# Patient Record
Sex: Male | Born: 1970 | Hispanic: No | Smoking: Former smoker
Health system: Southern US, Community
[De-identification: ages and names within clinical notes are randomized; demographics above are authoritative.]

## PROBLEM LIST (undated history)

## (undated) DIAGNOSIS — T7840XA Allergy, unspecified, initial encounter: Secondary | ICD-10-CM

## (undated) DIAGNOSIS — F419 Anxiety disorder, unspecified: Secondary | ICD-10-CM

## (undated) DIAGNOSIS — F32A Depression, unspecified: Secondary | ICD-10-CM

## (undated) DIAGNOSIS — G4730 Sleep apnea, unspecified: Secondary | ICD-10-CM

## (undated) DIAGNOSIS — K219 Gastro-esophageal reflux disease without esophagitis: Secondary | ICD-10-CM

## (undated) DIAGNOSIS — K3184 Gastroparesis: Secondary | ICD-10-CM

## (undated) DIAGNOSIS — M1990 Unspecified osteoarthritis, unspecified site: Secondary | ICD-10-CM

## (undated) DIAGNOSIS — J45909 Unspecified asthma, uncomplicated: Secondary | ICD-10-CM

## (undated) DIAGNOSIS — E119 Type 2 diabetes mellitus without complications: Secondary | ICD-10-CM

## (undated) DIAGNOSIS — I10 Essential (primary) hypertension: Secondary | ICD-10-CM

## (undated) DIAGNOSIS — E785 Hyperlipidemia, unspecified: Secondary | ICD-10-CM

## (undated) DIAGNOSIS — F259 Schizoaffective disorder, unspecified: Secondary | ICD-10-CM

## (undated) DIAGNOSIS — N529 Male erectile dysfunction, unspecified: Secondary | ICD-10-CM

## (undated) DIAGNOSIS — E669 Obesity, unspecified: Secondary | ICD-10-CM

## (undated) HISTORY — DX: Allergy, unspecified, initial encounter: T78.40XA

## (undated) HISTORY — DX: Male erectile dysfunction, unspecified: N52.9

## (undated) HISTORY — DX: Obesity, unspecified: E66.9

## (undated) HISTORY — DX: Unspecified asthma, uncomplicated: J45.909

## (undated) HISTORY — DX: Gastro-esophageal reflux disease without esophagitis: K21.9

## (undated) HISTORY — DX: Depression, unspecified: F32.A

## (undated) HISTORY — DX: Sleep apnea, unspecified: G47.30

## (undated) HISTORY — DX: Schizoaffective disorder, unspecified: F25.9

## (undated) HISTORY — DX: Hyperlipidemia, unspecified: E78.5

## (undated) HISTORY — DX: Type 2 diabetes mellitus without complications: E11.9

## (undated) HISTORY — DX: Unspecified osteoarthritis, unspecified site: M19.90

## (undated) HISTORY — DX: Gastroparesis: K31.84

## (undated) HISTORY — DX: Anxiety disorder, unspecified: F41.9

## (undated) HISTORY — DX: Essential (primary) hypertension: I10

---

## 2007-08-10 HISTORY — PX: OTHER SURGICAL HISTORY: SHX169

## 2010-08-09 HISTORY — PX: CHOLECYSTECTOMY, LAPAROSCOPIC: SHX56

## 2021-02-26 ENCOUNTER — Ambulatory Visit: Payer: Self-pay

## 2021-02-26 ENCOUNTER — Ambulatory Visit (INDEPENDENT_AMBULATORY_CARE_PROVIDER_SITE_OTHER): Payer: 59 | Admitting: Physician Assistant

## 2021-02-26 DIAGNOSIS — M25562 Pain in left knee: Secondary | ICD-10-CM

## 2021-02-26 DIAGNOSIS — M25561 Pain in right knee: Secondary | ICD-10-CM | POA: Diagnosis not present

## 2021-02-26 MED ORDER — METHYLPREDNISOLONE ACETATE 40 MG/ML IJ SUSP
40.0000 mg | INTRAMUSCULAR | Status: AC | PRN
  Administered 2021-02-26: 40 mg via INTRA_ARTICULAR

## 2021-02-26 MED ORDER — LIDOCAINE HCL 1 % IJ SOLN
5.0000 mL | INTRAMUSCULAR | Status: AC | PRN
  Administered 2021-02-26: 5 mL

## 2021-02-26 NOTE — Progress Notes (Signed)
Office Visit Note   Patient: Leon Ross           Date of Birth: [birth year]           MRN: [id number] Visit Date: 02/26/2021              Requested by: Dema Severin, NP [address] [place],  [place] [address] PCP: Dema Severin, NP  Chief Complaint  Patient presents with   Right Knee - Pain   Left Knee - Pain      HPI: Patient is a pleasant 50 year old gentleman who complains of right greater than left medial knee pain.  Is been going on for couple months.  He denies any injuries but he is very active and squats down quite a lot for his job.  He points to the pain on the medial aspect of his knee.  Sometimes he gets popping.  He has not done any treatment specific  Assessment & Plan: Visit Diagnoses:  1. Acute pain of right knee   2. Acute pain of left knee     Plan: Findings consistent with a degenerative tear of the medial meniscus.  We will try an injection today.  He only would like to try the injection on the right side as the left side is not terribly symptomatic to him.  He will follow-up in 3 weeks for reassessment  Follow-Up Instructions: No follow-ups on file.   Ortho Exam  Patient is alert, oriented, no adenopathy, well-dressed, normal affect, normal respiratory effort. Bilateral knees no effusions no cellulitis mild soft tissue swelling in the right knee.  On the left knee has good range of motion minimal tenderness over the medial joint line nothing over the lateral or the patellofemoral joint.  Good varus and valgus stability.  Negative Lachman sign On the right knee he he has tenderness directly over the medial joint line.  This is reproduced with flexion and slightly with extension.  No tenderness around the patellofemoral joint.  No varus valgus instability good endpoint on Lachman testing  Imaging: No results found. No images are attached to the encounter.  Labs: No results found for: HGBA1C, ESRSEDRATE, CRP, LABURIC, REPTSTATUS, GRAMSTAIN, CULT,  LABORGA   No results found for: ALBUMIN, PREALBUMIN, CBC  No results found for: MG No results found for: VD25OH  No results found for: PREALBUMIN No flowsheet data found.   There is no height or weight on file to calculate BMI.  Orders:  Orders Placed This Encounter  Procedures   XR Knee 1-2 Views Left   XR Knee 1-2 Views Right   No orders of the defined types were placed in this encounter.    Procedures: Large Joint Inj on 02/26/2021 11:58 AM Indications: pain and diagnostic evaluation Details: 22 G 1.5 in needle, anteromedial approach  Arthrogram: No  Medications: 40 mg methylPREDNISolone acetate 40 MG/ML; 5 mL lidocaine 1 % Outcome: tolerated well, no immediate complications Procedure, treatment alternatives, risks and benefits explained, specific risks discussed. Consent was given by the patient.     Clinical Data: No additional findings.  ROS:  All other systems negative, except as noted in the HPI. Review of Systems  Objective: Vital Signs: There were no vitals taken for this visit.  Specialty Comments:  No specialty comments available.  PMFS History: There are no problems to display for this patient.  No past medical history on file.  No family history on file.   Social History   Occupational History  Not on file  Tobacco Use   Smoking status: Not on file   Smokeless tobacco: Not on file  Substance and Sexual Activity   Alcohol use: Not on file   Drug use: Not on file   Sexual activity: Not on file

## 2021-03-19 ENCOUNTER — Encounter: Payer: Self-pay | Admitting: Orthopedic Surgery

## 2021-03-19 ENCOUNTER — Ambulatory Visit (INDEPENDENT_AMBULATORY_CARE_PROVIDER_SITE_OTHER): Payer: 59 | Admitting: Orthopedic Surgery

## 2021-03-19 DIAGNOSIS — M25561 Pain in right knee: Secondary | ICD-10-CM

## 2021-03-19 MED ORDER — HYDROCODONE-ACETAMINOPHEN 5-325 MG PO TABS: 1.0000 | ORAL_TABLET | ORAL | 0 refills | Status: DC | PRN

## 2021-03-19 NOTE — Progress Notes (Signed)
   Office Visit Note   Patient: Leon Ross           Date of Birth: 1970/12/29           MRN: [id number] Visit Date: 03/19/2021              Requested by: Dema Severin, NP [address] [place],  [place] [address] PCP: Dema Severin, NP  No chief complaint on file.     HPI: Patient is a pleasant gentleman who follows up today 3 weeks after having a injection into his right knee.  He has a 2-1/60-month history of medial right knee pain.  He states he has been climbing in and out of a [employer] quite a bit working for [employer]-.  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  She does have a history of falls and he has to help her up.  He says he cannot squat down because when he does his knee gets "stuck "he has mechanical symptoms of catching.  He said the injection did not give him relief  Assessment & Plan: Visit Diagnoses:  1. Acute pain of right knee     Plan: Patient will obtain an MRI should follow-up with [place] after that.  May need a arthroscopy for debridement of the medial meniscus tear  Follow-Up Instructions: No follow-ups on file.   Ortho Exam  Patient is alert, oriented, no adenopathy, well-dressed, normal affect, normal respiratory effort. Examination he has no redness he has no effusion.  He is focally tender over the medial joint line.  This is accentuated with terminal flexion and extension.  Good endpoint on Lachman testing  Imaging: No results found. No images are attached to the encounter.  Labs: No results found for: HGBA1C, ESRSEDRATE, CRP, LABURIC, REPTSTATUS, GRAMSTAIN, CULT, LABORGA   No results found for: ALBUMIN, PREALBUMIN, CBC  No results found for: MG No results found for: VD25OH  No results found for: PREALBUMIN No flowsheet data found.   There is no height or weight on file to calculate BMI.  Orders:  Orders Placed This Encounter  Procedures   MR Knee Right w/o contrast   Meds ordered this encounter  Medications    HYDROcodone-acetaminophen (NORCO/VICODIN) 5-325 MG tablet    Sig: Take 1 tablet by mouth every 4 (four) hours as needed for moderate pain.    Dispense:  30 tablet    Refill:  0     Procedures: No procedures performed  Clinical Data: No additional findings.  ROS:  All other systems negative, except as noted in the HPI. Review of Systems  Objective: Vital Signs: There were no vitals taken for this visit.  Specialty Comments:  No specialty comments available.  PMFS History: There are no problems to display for this patient.  No past medical history on file.  No family history on file.   Social History   Occupational History   Not on file  Tobacco Use   Smoking status: Not on file   Smokeless tobacco: Not on file  Substance and Sexual Activity   Alcohol use: Not on file   Drug use: Not on file   Sexual activity: Not on file

## 2021-04-10 ENCOUNTER — Ambulatory Visit (HOSPITAL_COMMUNITY): Payer: 59

## 2021-04-28 ENCOUNTER — Other Ambulatory Visit (HOSPITAL_COMMUNITY): Payer: 59

## 2021-04-30 ENCOUNTER — Ambulatory Visit (HOSPITAL_COMMUNITY)
Admission: RE | Admit: 2021-04-30 | Discharge: 2021-04-30 | Disposition: A | Discharge status: Home / Self Care | Payer: 59 | Source: Ambulatory Visit | Attending: Physician Assistant | Admitting: Physician Assistant

## 2021-04-30 ENCOUNTER — Other Ambulatory Visit: Payer: Self-pay

## 2021-04-30 DIAGNOSIS — M25561 Pain in right knee: Secondary | ICD-10-CM | POA: Insufficient documentation

## 2021-05-08 ENCOUNTER — Ambulatory Visit: Payer: 59 | Admitting: Physician Assistant

## 2021-05-12 ENCOUNTER — Ambulatory Visit (INDEPENDENT_AMBULATORY_CARE_PROVIDER_SITE_OTHER): Payer: 59 | Admitting: Orthopedic Surgery

## 2021-05-12 ENCOUNTER — Encounter: Payer: Self-pay | Admitting: Orthopedic Surgery

## 2021-05-12 DIAGNOSIS — M25561 Pain in right knee: Secondary | ICD-10-CM

## 2021-05-12 MED ORDER — METHYLPREDNISOLONE ACETATE 40 MG/ML IJ SUSP
40.0000 mg | INTRAMUSCULAR | Status: AC | PRN
  Administered 2021-05-12: 40 mg via INTRA_ARTICULAR

## 2021-05-12 MED ORDER — LIDOCAINE HCL (PF) 1 % IJ SOLN
5.0000 mL | INTRAMUSCULAR | Status: AC | PRN
  Administered 2021-05-12: 5 mL

## 2021-05-12 NOTE — Progress Notes (Signed)
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  He spent the afternoon dose follow-up status post MRI scan  She states his pain is  HPI: Pa merrily over the medial joint line.  Patient states that after his last and tient is a 50 year old woman  Assessment & Plan: Visit Diagnoses:  1. Acute pain of right knee     Plan: Right knee was injected he tolerated this well he will take 2 days off and an order is placed for physical therapy.  Follow-Up Instructions: Return in about 4 weeks (around 06/09/2021).   Ortho Exam  Patient is alert, oriented, no adenopathy, well-dressed, normal affect, normal respiratory effort. Examination there is no effusion collaterals and cruciates are stable he is tender to palpation on the patellofemoral joint and has crepitation of patellofemoral joint with range of motion he is also tender to palpation over the medial joint line lateral joint line is nontender to palpation.  Review of the MRI scan shows a possible tear of the lateral meniscus however patient is asymptomatic at this location it does show tendinosis of the patella tendon as well as cartilage irregularities of the trochlea there is a small effusion of the MRI scan.  The lateral meniscal finding of a possible meniscal tear is not consistent with his symptoms.  Imaging: No results found. No images are attached to the encounter.  Labs: No results found for: HGBA1C, ESRSEDRATE, CRP, LABURIC, REPTSTATUS, GRAMSTAIN, CULT, LABORGA   No results found for: ALBUMIN, PREALBUMIN, CBC  No results found for: MG No results found for: VD25OH  No results  found for: PREALBUMIN No flowsheet data found.   There is no height or weight on file to calculate BMI.  Orders:  No orders of the defined types were placed in this encounter.  No orders of the defined types were placed in this encounter.    Procedures: Large Joint Inj: R knee on 05/12/2021 3:57 PM Indications: pain and diagnostic evaluation Details: 22 G 1.5 in needle, anteromedial approach  Arthrogram: No  Medications: 5 mL lidocaine (PF) 1 %; 40 mg methylPREDNISolone acetate 40 MG/ML Outcome: tolerated well, no immediate complications Procedure, treatment alternatives, risks and benefits explained, specific risks discussed. Consent was given by the patient. Immediately prior to procedure a time out was called to verify the correct patient, procedure, equipment, support staff and site/side marked as required. Patient was prepped and draped in the usual sterile fashion.     Clinical Data: No additional findings.  ROS:  All other systems negative, except as noted in the HPI. Review of Systems  Objective: Vital Signs: There were no vitals taken for this visit.  Specialty Comments:  No specialty comments available.  PMFS History: There are no problems to display for this patient.  History reviewed. No pertinent past medical history.  History reviewed. No pertinent family history.  History reviewed. No pertinent surgical history. Social History   Occupational  History   Not on file  Tobacco Use   Smoking status: Not on file   Smokeless tobacco: Not on file  Substance and Sexual Activity   Alcohol use: Not on file   Drug use: Not on file   Sexual activity: Not on file

## 2021-05-15 ENCOUNTER — Encounter: Payer: Self-pay | Admitting: Urology

## 2021-05-15 ENCOUNTER — Ambulatory Visit (INDEPENDENT_AMBULATORY_CARE_PROVIDER_SITE_OTHER): Payer: 59 | Admitting: Urology

## 2021-05-15 ENCOUNTER — Other Ambulatory Visit: Payer: Self-pay

## 2021-05-15 VITALS — BP 119/75 | HR 90 | Ht 67.5 in | Wt 271.0 lb

## 2021-05-15 DIAGNOSIS — N5201 Erectile dysfunction due to arterial insufficiency: Secondary | ICD-10-CM | POA: Diagnosis not present

## 2021-05-15 DIAGNOSIS — E291 Testicular hypofunction: Secondary | ICD-10-CM

## 2021-05-15 MED ORDER — TADALAFIL 20 MG PO TABS: ORAL_TABLET | ORAL | 0 refills | Status: DC
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
## 2021-05-18 ENCOUNTER — Encounter: Payer: Self-pay | Admitting: Urology

## 2021-05-18 ENCOUNTER — Other Ambulatory Visit: Payer: Self-pay

## 2021-05-19 ENCOUNTER — Encounter: Payer: Self-pay | Admitting: Urology

## 2021-05-20 ENCOUNTER — Telehealth: Payer: Self-pay | Admitting: Urology

## 2021-05-20 NOTE — Telephone Encounter (Signed)
Patient would like to do the injections.

## 2021-05-20 NOTE — Telephone Encounter (Signed)
Labs from PCP reviewed and he has had 2 low testosterone levels.  LH level was normal.  He would be a candidate for topical testosterone gel or intramuscular injections depending on his preference.  Both are effective.  He will most likely need prior authorization for both forms of treatment.  Let me know if he has any questions or which method he prefers.

## 2021-05-21 ENCOUNTER — Emergency Department (HOSPITAL_COMMUNITY)
Admission: EM | Admit: 2021-05-21 | Discharge: 2021-05-22 | Disposition: A | Discharge status: Other Acute Inpatient Hospital | Payer: 59 | Attending: Emergency Medicine | Admitting: Emergency Medicine

## 2021-05-21 ENCOUNTER — Encounter (HOSPITAL_COMMUNITY): Payer: Self-pay | Admitting: Emergency Medicine

## 2021-05-21 ENCOUNTER — Other Ambulatory Visit: Payer: Self-pay

## 2021-05-21 DIAGNOSIS — R45851 Suicidal ideations: Secondary | ICD-10-CM | POA: Insufficient documentation

## 2021-05-21 DIAGNOSIS — Z7984 Long term (current) use of oral hypoglycemic drugs: Secondary | ICD-10-CM | POA: Insufficient documentation

## 2021-05-21 DIAGNOSIS — Z20822 Contact with and (suspected) exposure to covid-19: Secondary | ICD-10-CM | POA: Insufficient documentation

## 2021-05-21 DIAGNOSIS — F3110 Bipolar disorder, current episode manic without psychotic features, unspecified: Secondary | ICD-10-CM | POA: Insufficient documentation

## 2021-05-21 DIAGNOSIS — E119 Type 2 diabetes mellitus without complications: Secondary | ICD-10-CM | POA: Insufficient documentation

## 2021-05-21 LAB — COMPREHENSIVE METABOLIC PANEL
ALT: 20 U/L (ref 0–44)
AST: 18 U/L (ref 15–41)
Albumin: 4 g/dL (ref 3.5–5.0)
Alkaline Phosphatase: 89 U/L (ref 38–126)
Anion gap: 8 (ref 5–15)
BUN: 14 mg/dL (ref 6–20)
CO2: 27 mmol/L (ref 22–32)
Calcium: 9.3 mg/dL (ref 8.9–10.3)
Chloride: 104 mmol/L (ref 98–111)
Creatinine, Ser: 0.97 mg/dL (ref 0.61–1.24)
GFR, Estimated: >60 mL/min (ref >60)
Glucose, Bld: 127 mg/dL — ABNORMAL HIGH (ref 70–99)
Potassium: 4 mmol/L (ref 3.5–5.1)
Sodium: 139 mmol/L (ref 135–145)
Total Bilirubin: 0.4 mg/dL (ref 0.3–1.2)
Total Protein: 7.6 g/dL (ref 6.5–8.1)

## 2021-05-21 LAB — CBC WITH DIFFERENTIAL/PLATELET
Abs Immature Granulocytes: 0.03 10*3/uL (ref 0.00–0.07)
Basophils Absolute: 0 10*3/uL (ref 0.0–0.1)
Basophils Relative: 0 %
Eosinophils Absolute: 0.2 10*3/uL (ref 0.0–0.5)
Eosinophils Relative: 2 %
HCT: 41.7 % (ref 39.0–52.0)
Hemoglobin: 13.5 g/dL (ref 13.0–17.0)
Immature Granulocytes: 0 %
Lymphocytes Relative: 21 %
Lymphs Abs: 2.1 10*3/uL (ref 0.7–4.0)
MCH: 24.7 pg — ABNORMAL LOW (ref 26.0–34.0)
MCHC: 32.4 g/dL (ref 30.0–36.0)
MCV: 76.2 fL — ABNORMAL LOW (ref 80.0–100.0)
Monocytes Absolute: 0.7 10*3/uL (ref 0.1–1.0)
Monocytes Relative: 7 %
Neutro Abs: 7 10*3/uL (ref 1.7–7.7)
Neutrophils Relative %: 70 %
Platelets: 316 10*3/uL (ref 150–400)
RBC: 5.47 MIL/uL (ref 4.22–5.81)
RDW: 14.3 % (ref 11.5–15.5)
WBC: 10 10*3/uL (ref 4.0–10.5)
nRBC: 0 % (ref 0.0–0.2)

## 2021-05-21 LAB — URINALYSIS, ROUTINE W REFLEX MICROSCOPIC
Bilirubin Urine: NEGATIVE
Glucose, UA: NEGATIVE mg/dL
Hgb urine dipstick: NEGATIVE
Ketones, ur: NEGATIVE mg/dL
Leukocytes,Ua: NEGATIVE
Nitrite: NEGATIVE
Protein, ur: NEGATIVE mg/dL
Specific Gravity, Urine: 1.006 (ref 1.005–1.030)
pH: 6 (ref 5.0–8.0)

## 2021-05-21 LAB — RESP PANEL BY RT-PCR (FLU A&B, COVID) ARPGX2
Influenza A by PCR: NEGATIVE
Influenza B by PCR: NEGATIVE
SARS Coronavirus 2 by RT PCR: NEGATIVE

## 2021-05-21 LAB — RAPID URINE DRUG SCREEN, HOSP PERFORMED
Amphetamines: NOT DETECTED
Barbiturates: NOT DETECTED
Benzodiazepines: NOT DETECTED
Cocaine: NOT DETECTED
Opiates: NOT DETECTED
Tetrahydrocannabinol: NOT DETECTED

## 2021-05-21 LAB — ETHANOL: Alcohol, Ethyl (B): <10 mg/dL (ref <10)

## 2021-05-21 MED ORDER — TESTOSTERONE CYPIONATE 200 MG/ML IM SOLN: 200.0000 mg | INTRAMUSCULAR | 0 refills | Status: DC

## 2021-05-21 NOTE — ED Notes (Signed)
Provided pt with sandwich and sprite.
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
## 2021-05-21 NOTE — ED Notes (Signed)
Patients wife would like a call back with an update: Zoe [phone]

## 2021-05-21 NOTE — Telephone Encounter (Signed)
Rx sent to pharmacy.  Needs an office visit for injection training.  Schedule midcycle testosterone level 6 weeks after starting therapy

## 2021-05-21 NOTE — Progress Notes (Addendum)
[facility] Pending Acceptance for 05/22/21  [facility] pending acceptance per pending discharges and morning [facility] will coordinate. Pending [facility] Room 300-2. Pt meets inpatient criteria pre [place] Abts, PA-C. Accepting physician Dr. Mason Jim; dx bipolar manic.    Maryjean Ka, MSW, LCSWA 05/21/2021 11:55 PM

## 2021-05-21 NOTE — Addendum Note (Signed)
Addended by: Irineo Axon C on: 05/21/2021 12:49 PM   Modules accepted: Orders

## 2021-05-21 NOTE — ED Triage Notes (Signed)
Pt arrives POV w/ c/o suicidal thoughts and hearing voices. Pt states he has been hearing voices x2 days now and they are telling him to hurt himself but no one else. Pt states he does not have a plan
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
## 2021-05-22 ENCOUNTER — Inpatient Hospital Stay (HOSPITAL_COMMUNITY)
Admission: AD | Admit: 2021-05-22 | Discharge: 2021-05-29 | DRG: 885 | Disposition: A | Discharge status: Home / Self Care | Payer: 59 | Attending: Emergency Medicine | Admitting: Emergency Medicine

## 2021-05-22 ENCOUNTER — Encounter (HOSPITAL_COMMUNITY): Payer: Self-pay | Admitting: Emergency Medicine

## 2021-05-22 DIAGNOSIS — F259 Schizoaffective disorder, unspecified: Secondary | ICD-10-CM | POA: Diagnosis present

## 2021-05-22 DIAGNOSIS — F4310 Post-traumatic stress disorder, unspecified: Secondary | ICD-10-CM | POA: Diagnosis present

## 2021-05-22 DIAGNOSIS — Z7984 Long term (current) use of oral hypoglycemic drugs: Secondary | ICD-10-CM

## 2021-05-22 DIAGNOSIS — G4700 Insomnia, unspecified: Secondary | ICD-10-CM | POA: Diagnosis present

## 2021-05-22 DIAGNOSIS — E669 Obesity, unspecified: Secondary | ICD-10-CM | POA: Diagnosis present

## 2021-05-22 DIAGNOSIS — E785 Hyperlipidemia, unspecified: Secondary | ICD-10-CM | POA: Diagnosis present

## 2021-05-22 DIAGNOSIS — Z20822 Contact with and (suspected) exposure to covid-19: Secondary | ICD-10-CM | POA: Diagnosis present

## 2021-05-22 DIAGNOSIS — Z8659 Personal history of other mental and behavioral disorders: Secondary | ICD-10-CM

## 2021-05-22 DIAGNOSIS — R45851 Suicidal ideations: Secondary | ICD-10-CM | POA: Diagnosis present

## 2021-05-22 DIAGNOSIS — E1169 Type 2 diabetes mellitus with other specified complication: Secondary | ICD-10-CM | POA: Diagnosis present

## 2021-05-22 DIAGNOSIS — K219 Gastro-esophageal reflux disease without esophagitis: Secondary | ICD-10-CM | POA: Diagnosis present

## 2021-05-22 DIAGNOSIS — E119 Type 2 diabetes mellitus without complications: Secondary | ICD-10-CM | POA: Diagnosis present

## 2021-05-22 DIAGNOSIS — F250 Schizoaffective disorder, bipolar type: Principal | ICD-10-CM | POA: Diagnosis present

## 2021-05-22 DIAGNOSIS — F319 Bipolar disorder, unspecified: Secondary | ICD-10-CM | POA: Diagnosis present

## 2021-05-22 HISTORY — DX: Schizoaffective disorder, unspecified: F25.9

## 2021-05-22 LAB — GLUCOSE, CAPILLARY
Glucose-Capillary: 171 mg/dL — ABNORMAL HIGH (ref 70–99)
Glucose-Capillary: 147 mg/dL — ABNORMAL HIGH (ref 70–99)

## 2021-05-22 MED ORDER — TRAZODONE HCL 150 MG PO TABS
300.0000 mg | ORAL_TABLET | Freq: Every day | ORAL | Status: DC
  Administered 2021-05-22: 300 mg via ORAL
  Filled 2021-05-22 (×3): qty 2

## 2021-05-22 MED ORDER — MIRTAZAPINE 15 MG PO TABS
15.0000 mg | ORAL_TABLET | Freq: Every day | ORAL | Status: DC
  Administered 2021-05-22 – 2021-05-28 (×7): 15 mg via ORAL
  Filled 2021-05-22 (×10): qty 1

## 2021-05-22 MED ORDER — INSULIN DEGLUDEC 200 UNIT/ML ~~LOC~~ SOPN: PEN_INJECTOR | Freq: Every day | SUBCUTANEOUS | Status: DC

## 2021-05-22 MED ORDER — PANTOPRAZOLE SODIUM 40 MG PO TBEC
40.0000 mg | DELAYED_RELEASE_TABLET | Freq: Every day | ORAL | Status: DC
  Administered 2021-05-22 – 2021-05-29 (×8): 40 mg via ORAL
  Filled 2021-05-22 (×11): qty 1

## 2021-05-22 MED ORDER — MAGNESIUM HYDROXIDE 400 MG/5ML PO SUSP: 30.0000 mL | Freq: Every day | ORAL | Status: DC | PRN

## 2021-05-22 MED ORDER — ACETAMINOPHEN 325 MG PO TABS
650.0000 mg | ORAL_TABLET | Freq: Four times a day (QID) | ORAL | Status: DC | PRN
  Administered 2021-05-24 (×2): 650 mg via ORAL
  Filled 2021-05-22 (×2): qty 2

## 2021-05-22 MED ORDER — TETANUS-DIPHTHERIA TOXOIDS TD 5-2 LFU IM INJ: 0.5000 mL | INJECTION | Freq: Once | INTRAMUSCULAR | Status: DC

## 2021-05-22 MED ORDER — ALUM & MAG HYDROXIDE-SIMETH 200-200-20 MG/5ML PO SUSP: 30.0000 mL | ORAL | Status: DC | PRN

## 2021-05-22 MED ORDER — LAMOTRIGINE 150 MG PO TABS
150.0000 mg | ORAL_TABLET | Freq: Every day | ORAL | Status: DC
  Administered 2021-05-23 – 2021-05-29 (×7): 150 mg via ORAL
  Filled 2021-05-22 (×10): qty 1

## 2021-05-22 MED ORDER — TRAZODONE HCL 50 MG PO TABS: 50.0000 mg | ORAL_TABLET | Freq: Every evening | ORAL | Status: DC | PRN

## 2021-05-22 MED ORDER — INSULIN ASPART 100 UNIT/ML IJ SOLN
0.0000 [IU] | Freq: Three times a day (TID) | INTRAMUSCULAR | Status: DC
Start: 2021-05-22 — End: 2021-05-29
  Administered 2021-05-22 – 2021-05-28 (×6): 1 [IU] via SUBCUTANEOUS

## 2021-05-22 MED ORDER — GLYBURIDE 5 MG PO TABS
5.0000 mg | ORAL_TABLET | Freq: Two times a day (BID) | ORAL | Status: DC
  Administered 2021-05-22 – 2021-05-29 (×14): 5 mg via ORAL
  Filled 2021-05-22 (×18): qty 1

## 2021-05-22 MED ORDER — HYDROXYZINE HCL 25 MG PO TABS
25.0000 mg | ORAL_TABLET | Freq: Three times a day (TID) | ORAL | Status: DC | PRN
  Administered 2021-05-26 – 2021-05-28 (×3): 25 mg via ORAL
  Filled 2021-05-22 (×3): qty 1

## 2021-05-22 MED ORDER — OLANZAPINE 10 MG PO TABS
10.0000 mg | ORAL_TABLET | Freq: Every day | ORAL | Status: DC
  Administered 2021-05-22: 10 mg via ORAL
  Filled 2021-05-22 (×2): qty 1

## 2021-05-22 MED ORDER — INSULIN GLARGINE-YFGN 100 UNIT/ML ~~LOC~~ SOLN
20.0000 [IU] | Freq: Every day | SUBCUTANEOUS | Status: DC
  Administered 2021-05-22 – 2021-05-29 (×8): 20 [IU] via SUBCUTANEOUS
  Filled 2021-05-22 (×7): qty 0.2

## 2021-05-22 MED ORDER — METFORMIN HCL ER 500 MG PO TB24
500.0000 mg | ORAL_TABLET | Freq: Two times a day (BID) | ORAL | Status: DC
  Administered 2021-05-22 – 2021-05-29 (×14): 500 mg via ORAL
  Filled 2021-05-22 (×20): qty 1

## 2021-05-22 MED ORDER — TRAZODONE HCL 100 MG PO TABS: 100.0000 mg | ORAL_TABLET | Freq: Every evening | ORAL | Status: DC | PRN

## 2021-05-22 MED ORDER — SERTRALINE HCL 100 MG PO TABS
100.0000 mg | ORAL_TABLET | Freq: Every day | ORAL | Status: DC
  Administered 2021-05-22 – 2021-05-28 (×7): 100 mg via ORAL
  Filled 2021-05-22 (×11): qty 1

## 2021-05-22 MED ORDER — ROSUVASTATIN CALCIUM 20 MG PO TABS
20.0000 mg | ORAL_TABLET | Freq: Every day | ORAL | Status: DC
  Administered 2021-05-22 – 2021-05-29 (×8): 20 mg via ORAL
  Filled 2021-05-22 (×10): qty 1

## 2021-05-22 NOTE — Group Note (Signed)
LCSW Group Therapy Note   Group Date: 05/22/2021 Start Time: 1300 End Time: 1400   Type of Therapy and Topic:  Group Therapy:   Participation Level:  Active  Summary of Progress/Problems: Leon Ross stated that he was grateful for his girlfriend who is supportive. Pt received the packet for group. Pt followed along throughout the group and discussed things to be grateful for with their peers.  Pt interacted appropriately and remained on topic.   Aram Beecham, LCSWA 05/22/2021  1:43 PM
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
## 2021-05-22 NOTE — BH Assessment (Signed)
BHH Assessment Progress Note   Per Melbourne Abts, PA, this pt requires psychiatric hospitalization at this time.  Danika, RN, [facility] has assigned pt to [facility] Rm 300-2 to the service of Dr Mason Jim.  [facility]HH is now ready to receive pt.  Pt has signed Voluntary Admission and Consent for Treatment, as well as Consent to Release Information to [facility], to his fiancee and to his brother, and a notification call has been placed to the provider.  Signed forms have been faxed to [facility].  EDP Derwood Kaplan, MD and pt's nurse, Carolynne, have been notified, and Carolynne agrees to send original paperwork along with pt via [employer], and to call report to [phone].  Doylene Canning, [place] Behavioral Health Coordinator [phone]

## 2021-05-22 NOTE — Progress Notes (Signed)
Psychoeducational Group Note  Date:  05/22/2021 Time:  2000  Group Topic/Focus:  Evening AA meeting  Participation Level: Did Not Attend  Participation Quality:  Not Applicable  Affect:  Not Applicable  Cognitive:  Not Applicable  Insight:  Not Applicable  Engagement in Group: Not Applicable  Additional Comments:  Did not attend.   Marcille Buffy 05/22/2021, 9:44 PM

## 2021-05-22 NOTE — ED Notes (Signed)
Assumed care of patient, waiting on evaluation from behavioral health

## 2021-05-22 NOTE — Progress Notes (Signed)
   05/22/21 2133  Psych Admission Type (Psych Patients Only)  Admission Status Voluntary  Psychosocial Assessment  Patient Complaints Anxiety;Worrying  Eye Contact Brief;Fair  Facial Expression Anxious;Pensive  Affect Anxious  Speech Logical/coherent  Interaction Guarded;Minimal  Motor Activity Slow  Appearance/Hygiene In scrubs  Behavior Characteristics Cooperative;Appropriate to situation  Mood Anxious;Pleasant  Thought Process  Coherency Concrete thinking  Content WDL  Delusions None reported or observed  Perception Hallucinations  Hallucination Auditory;Visual  Judgment Poor  Confusion None  Danger to Self  Current suicidal ideation? Denies  Danger to Others  Danger to Others None reported or observed

## 2021-05-22 NOTE — ED Notes (Signed)
Pt is being assessed by behavioral health

## 2021-05-22 NOTE — ED Notes (Signed)
Pt easily visualized from RN station, sleeping. Equal chest rise noted. Will continue to monitor.
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
## 2021-05-22 NOTE — ED Notes (Signed)
1 bag of pt belongings returned to pt. Pt updated that transport has been notified.

## 2021-05-22 NOTE — Tx Team (Signed)
Initial Treatment Plan 05/22/2021 12:05 PM Seanpaul Preece HDQ:[id number]    PATIENT STRESSORS: Health problems   Medication change or noncompliance   Traumatic event     PATIENT STRENGTHS: Ability for insight  Average or above average intelligence  Communication skills  Motivation for treatment/growth  Physical Health  Supportive family/friends    PATIENT IDENTIFIED PROBLEMS: anxiety  avh  si                 DISCHARGE CRITERIA:  Ability to meet basic life and health needs Improved stabilization in mood, thinking, and/or behavior Motivation to continue treatment in a less acute level of care Need for constant or close observation no longer present  PRELIMINARY DISCHARGE PLAN: Attend aftercare/continuing care group Return to previous living arrangement Return to previous work or school arrangements  PATIENT/FAMILY INVOLVEMENT: This treatment plan has been presented to and reviewed with the patient, Mable Lashley.  The patient and family have been given the opportunity to ask questions and make suggestions.  Raylene Miyamoto, RN 05/22/2021, 12:05 PM

## 2021-05-22 NOTE — ED Notes (Signed)
Safe transport notified of pts need for transportation to [facility].

## 2021-05-22 NOTE — ED Notes (Signed)
Pt updated to current plan of care, pt verbalized understanding. Pt calm and cooperative at this time.

## 2021-05-22 NOTE — ED Notes (Signed)
Pt provided breakfast tray, hygiene supplies.  No further needs expressed. Pt calm and cooperative. Will continue to monitor.

## 2021-05-23 DIAGNOSIS — K219 Gastro-esophageal reflux disease without esophagitis: Secondary | ICD-10-CM | POA: Diagnosis present

## 2021-05-23 DIAGNOSIS — E669 Obesity, unspecified: Secondary | ICD-10-CM | POA: Diagnosis present

## 2021-05-23 DIAGNOSIS — F250 Schizoaffective disorder, bipolar type: Principal | ICD-10-CM

## 2021-05-23 DIAGNOSIS — E1169 Type 2 diabetes mellitus with other specified complication: Secondary | ICD-10-CM | POA: Diagnosis present

## 2021-05-23 DIAGNOSIS — E785 Hyperlipidemia, unspecified: Secondary | ICD-10-CM | POA: Diagnosis present

## 2021-05-23 DIAGNOSIS — Z8659 Personal history of other mental and behavioral disorders: Secondary | ICD-10-CM

## 2021-05-23 LAB — LIPID PANEL
Cholesterol: 199 mg/dL (ref 0–200)
HDL: 35 mg/dL — ABNORMAL LOW (ref >40)
LDL Cholesterol: 108 mg/dL — ABNORMAL HIGH (ref 0–99)
Total CHOL/HDL Ratio: 5.7 RATIO
Triglycerides: 278 mg/dL — ABNORMAL HIGH (ref <150)
VLDL: 56 mg/dL — ABNORMAL HIGH (ref 0–40)

## 2021-05-23 LAB — GLUCOSE, CAPILLARY
Glucose-Capillary: 136 mg/dL — ABNORMAL HIGH (ref 70–99)
Glucose-Capillary: 123 mg/dL — ABNORMAL HIGH (ref 70–99)
Glucose-Capillary: 101 mg/dL — ABNORMAL HIGH (ref 70–99)
Glucose-Capillary: 203 mg/dL — ABNORMAL HIGH (ref 70–99)

## 2021-05-23 LAB — TSH: TSH: 2.476 u[IU]/mL (ref 0.350–4.500)

## 2021-05-23 LAB — HEMOGLOBIN A1C
Hgb A1c MFr Bld: 6.1 % — ABNORMAL HIGH (ref 4.8–5.6)
Mean Plasma Glucose: 128.37 mg/dL

## 2021-05-23 MED ORDER — TRAZODONE HCL 150 MG PO TABS
150.0000 mg | ORAL_TABLET | Freq: Every day | ORAL | Status: DC
  Administered 2021-05-23 – 2021-05-28 (×6): 150 mg via ORAL
  Filled 2021-05-23 (×10): qty 1

## 2021-05-23 MED ORDER — OLANZAPINE 7.5 MG PO TABS
15.0000 mg | ORAL_TABLET | Freq: Every day | ORAL | Status: DC
  Administered 2021-05-23 – 2021-05-26 (×4): 15 mg via ORAL
  Filled 2021-05-23 (×6): qty 2

## 2021-05-23 NOTE — [facility] Group Notes (Signed)
Pt did not attend Psych-Educational Group.  

## 2021-05-23 NOTE — [facility] Group Notes (Signed)
Pt did not attend Relaxation Group. 

## 2021-05-23 NOTE — Progress Notes (Signed)
EKG results placed on the outside of pt's shadow chart  Normal sinus rhythm Normal ECG  QT/QTc- Baz  352/440 ms                                Qtc

## 2021-05-23 NOTE — Progress Notes (Signed)
CSW made multiple attempts to meet with patient throughout the day today. Pt remains in bed sleeping heavily. CSW continuing to assess and another attempt will be made tomorrow, 10/16.   Lynora Dymond S. Alan Ripper, MSW, LCSW Clinical Social Worker 05/23/2021 3:13 PM
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
## 2021-05-23 NOTE — Progress Notes (Addendum)
   05/23/21 1400  Psych Admission Type (Psych Patients Only)  Admission Status Voluntary  Psychosocial Assessment  Patient Complaints Depression  Eye Contact Brief  Facial Expression Anxious  Affect Anxious  Speech Logical/coherent  Interaction Guarded;Minimal  Motor Activity Slow  Appearance/Hygiene In scrubs  Behavior Characteristics Cooperative;Appropriate to situation  Mood Depressed;Pleasant  Thought Process  Coherency Concrete thinking  Content WDL  Delusions None reported or observed  Perception Hallucinations  Hallucination None reported or observed  Judgment Poor  Confusion None  Danger to Self  Current suicidal ideation? Denies  Danger to Others  Danger to Others None reported or observed    D. Pt presented as depressed, minimal upon initial approach. Pt voiced complaints of poor sleep, low energy and right knee pain 6/10. Pt did not attend groups today despite encouragement from staff, stating that he wanted to "get some decent sleep". Per pt's self inventory, pt rated his depression, hopelessness and anxiety an 8/6/5, respectively. Pt currently denies SI/HI and AVH   A. Labs and vitals monitored. Pt given and educated on medications. Pt supported emotionally and encouraged to express concerns and ask questions.   R. Pt remains safe with 15 minute checks. Will continue POC.

## 2021-05-23 NOTE — Progress Notes (Signed)
   05/23/21 2218  Psych Admission Type (Psych Patients Only)  Admission Status Voluntary  Psychosocial Assessment  Patient Complaints Depression  Eye Contact Brief  Facial Expression Flat  Affect Appropriate to circumstance  Speech Logical/coherent  Interaction Assertive  Motor Activity Other (Comment) (WDL)  Appearance/Hygiene In scrubs  Behavior Characteristics Appropriate to situation  Mood Depressed;Pleasant  Thought Process  Coherency Concrete thinking  Content WDL  Delusions None reported or observed  Perception WDL  Hallucination None reported or observed  Judgment Poor  Confusion None  Danger to Self  Current suicidal ideation? Denies  Danger to Others  Danger to Others None reported or observed

## 2021-05-23 NOTE — Progress Notes (Signed)
[facility] Group Notes:  (Nursing/MHT/Case Management/Adjunct)  Date:  05/23/2021  Time:  2000  Type of Therapy:   wrap up group  Participation Level:  Active  Participation Quality:  Appropriate, Attentive, Sharing, and Supportive  Affect:  Appropriate  Cognitive:  Alert  Insight:  Improving  Engagement in Group:  Engaged  Modes of Intervention:  Clarification, Education, and Support  Summary of Progress/Problems: Positive thinking and positive change were discussed.   Marcille Buffy 05/23/2021, 8:44 PM

## 2021-05-23 NOTE — [facility] Group Notes (Signed)
Adult Psychoeducational Group Note  Date:  05/23/2021 Time:  9:27 AM  Group Topic/Focus:  Goals Group:   The focus of this group is to help patients establish daily goals to achieve during treatment and discuss how the patient can incorporate goal setting into their daily lives to aide in recovery.  Participation Level:  Did Not Attend  Margaret Pyle 05/23/2021, 9:27 AM

## 2021-05-23 NOTE — Group Note (Signed)
Group Topic: Goal Setting  Group Date: 05/23/2021 Start Time: 0900 End Time: 1000 Facilitators: Dione Housekeeper, RN  Department: [facility] INPATIENT ADULT [facility]  Number of Participants: 13  Group Focus: affirmation, clarity of thought, and goals/reality orientation Treatment Modality:  Psychoeducation Interventions utilized were assignment, group exercise, and support Purpose: To be able to understand and verbalize the reason for their admission to the hospital. To understand that the medication helps with their chemical imbalance but they also need to work on their choices in life. To be challenged to develop a list of 30 positives about themselves. Also introduce the concept that "feelings" are not reality.  Name: Leon Ross Date of Birth: [birth year]  MR: [id number]    Level of Participation:  Did not attend

## 2021-05-24 LAB — GLUCOSE, CAPILLARY
Glucose-Capillary: 148 mg/dL — ABNORMAL HIGH (ref 70–99)
Glucose-Capillary: 111 mg/dL — ABNORMAL HIGH (ref 70–99)
Glucose-Capillary: 168 mg/dL — ABNORMAL HIGH (ref 70–99)
Glucose-Capillary: 200 mg/dL — ABNORMAL HIGH (ref 70–99)

## 2021-05-24 MED ORDER — IBUPROFEN 600 MG PO TABS
600.0000 mg | ORAL_TABLET | Freq: Four times a day (QID) | ORAL | Status: DC | PRN
  Administered 2021-05-24 – 2021-05-29 (×9): 600 mg via ORAL
  Filled 2021-05-24 (×9): qty 1

## 2021-05-24 NOTE — Group Note (Unsigned)
Group Topic: Other  Group Date: 05/24/2021 Start Time: 0900 End Time: 1000 Facilitators: Dione Housekeeper, RN  [facility]  Number of Participants: 16  Group Focus: anxiety and clarity of thought Treatment Modality:  Cognitive Behavioral Therapy, Skills Training, and Spiritual Interventions utilized were exploration and support Purpose: enhance coping skills and reinforce self-care   Name: Yannick Steuber Date of Birth: 08/30/1970  MR: [id number]    Level of Participation: {THERAPIES; PSYCH GROUP PARTICIPATION PFXTK:24097} Quality of Participation: {THERAPIES; PSYCH QUALITY OF PARTICIPATION:23992} Interactions with others: {THERAPIES; PSYCH INTERACTIONS:23993} Mood/Affect: {THERAPIES; PSYCH MOOD/AFFECT:23994} Triggers (if applicable): *** Cognition: {THERAPIES; PSYCH COGNITION:23995} Progress: {THERAPIES; PSYCH PROGRESS:23997} Response: *** Plan: {THERAPIES; PSYCH DZHG:99242}  Patients Problems:  Patient Active Problem List   Diagnosis Date Noted   Diabetes mellitus type 2 in obese (HCC) 05/23/2021   Dyslipidemia 05/23/2021   History of posttraumatic stress disorder (PTSD) 05/23/2021   GERD (gastroesophageal reflux disease) 05/23/2021   Schizoaffective disorder, bipolar type (HCC) 05/22/2021

## 2021-05-24 NOTE — [facility] Group Notes (Signed)
Pt did not attend Relaxation Group this afternoon.  
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
## 2021-05-24 NOTE — Group Note (Addendum)
Late Entry for 05/23/2021  Clinical Social Work Note  No group could be held this day due to low staffing.  Other licensed group was held.  Taraneh Metheney Grossman-Orr, LCSW 05/23/2021    

## 2021-05-24 NOTE — Group Note (Signed)
Group Topic: Progressive Relaxation: PROGRESSIVE RELAXATION. A group where deep breathing is taught and tensing and relaxation muscle groups is used. Imagery is used as well.  Pts are asked to imagine 3 pillars that hold them up when they are not able to hold themselves up. Group Date: 05/24/2021 Start Time: 0900 End Time: 1000 Facilitators: Dione Housekeeper, RN  Department: [facility]  Number of Participants: 16  Group Focus: anxiety and clarity of thought Treatment Modality:  Cognitive Behavioral Therapy, Skills Training, and Spiritual Interventions utilized were exploration and support Purpose: enhance coping skills and reinforce self-care  Name: Jorje Vanatta Date of Birth: [birth year]  MR: [id number]    Level of Participation: active Quality of Participation: cooperative, engaged, and offered feedback Interactions with others: gave feedback Mood/Affect: appropriate Triggers (if applicable):  Cognition: coherent/clear Progress: Moderate Response: Pt participated fully in the group. Stated the three pillars that hold him up are his: family, his artwork and his fiance. Did the progressive relaxation steps throughout Plan: patient will be encouraged to continue to come to group and participate.  Patients Problems:  Patient Active Problem List   Diagnosis Date Noted   Diabetes mellitus type 2 in obese (HCC) 05/23/2021   Dyslipidemia 05/23/2021   History of posttraumatic stress disorder (PTSD) 05/23/2021   GERD (gastroesophageal reflux disease) 05/23/2021   Schizoaffective disorder, bipolar type (HCC) 05/22/2021

## 2021-05-24 NOTE — Progress Notes (Addendum)
   05/24/21 1000  Psych Admission Type (Psych Patients Only)  Admission Status Voluntary  Psychosocial Assessment  Patient Complaints Anxiety;Depression  Eye Contact Brief  Facial Expression Flat  Affect Appropriate to circumstance  Speech Logical/coherent  Interaction Assertive  Motor Activity Other (Comment) (WDL)  Appearance/Hygiene In scrubs  Behavior Characteristics Cooperative;Appropriate to situation  Mood Depressed  Thought Process  Coherency Concrete thinking  Content WDL  Delusions None reported or observed  Perception WDL  Hallucination None reported or observed  Judgment Poor  Confusion None  Danger to Self  Current suicidal ideation? Denies  Danger to Others  Danger to Others None reported or observed    D. Pt presented with a flat affect/ depressed mood, and reported only sleeping 3 hours last night. Pt encouraged to stay out of his room today and to attend groups, as this would help his sleep. Pt denied SI/HI and A/VH, and  rated his depression, hopelessness and anxiety a 6/5/8, respectively.  Pt reported that his goal today was to get ready for his visit with his fiance. Pt observed attending group led by SW this am. A. Labs and vitals monitored. Pt given scheduled meds, and tylenol for right knee pain 7/10. Pt supported emotionally and encouraged to express concerns and ask questions.   R. Pt remains safe with 15 minute checks. Will continue POC.

## 2021-05-24 NOTE — [facility] Group Notes (Signed)
Pt attended morning group with the nurse. 

## 2021-05-24 NOTE — Progress Notes (Signed)
[facility] Group Notes:  (Nursing/MHT/Case Management/Adjunct)  Date:  05/24/2021  Time:  2015  Type of Therapy:   wrap up group  Participation Level:  Active  Participation Quality:  Appropriate, Attentive, Sharing, and Supportive  Affect:  Appropriate  Cognitive:  Appropriate  Insight:  Improving  Engagement in Group:  Engaged  Modes of Intervention:  Clarification, Education, and Support  Summary of Progress/Problems: Positive thinking and self-care were discussed.   Marcille Buffy 05/24/2021, 8:47 PM
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
## 2021-05-25 ENCOUNTER — Other Ambulatory Visit: Payer: Self-pay | Admitting: Family

## 2021-05-25 ENCOUNTER — Encounter (HOSPITAL_COMMUNITY): Payer: Self-pay

## 2021-05-25 LAB — GLUCOSE, CAPILLARY
Glucose-Capillary: 122 mg/dL — ABNORMAL HIGH (ref 70–99)
Glucose-Capillary: 105 mg/dL — ABNORMAL HIGH (ref 70–99)
Glucose-Capillary: 132 mg/dL — ABNORMAL HIGH (ref 70–99)
Glucose-Capillary: 143 mg/dL — ABNORMAL HIGH (ref 70–99)

## 2021-05-25 NOTE — Progress Notes (Signed)
Inpatient Diabetes Program Recommendations  AACE/ADA: New Consensus Statement on Inpatient Glycemic Control (2015)  Target Ranges:  Prepandial:   less than 140 mg/dL      Peak postprandial:   less than 180 mg/dL (1-2 hours)      Critically ill patients:  140 - 180 mg/dL   Lab Results  Component Value Date   GLUCAP 122 (H) 05/25/2021   HGBA1C 6.1 (H) 05/23/2021    Review of Glycemic Control  Diabetes history: DM2 Outpatient Diabetes medications: Tresiba 20 QD, metformin 500 BID, glyburide 5 mg BID Current orders for Inpatient glycemic control: Semglee 20 QD, Novolog 0-6 units TID, metformin 500 BID, glyburide 5 mg BID  HgbA1C - 6.1%  Inpatient Diabetes Program Recommendations:    Consider d/cing glyburide 5 mg BID while inpatient to avoid hypoglycemia.  Will continue to follow glucose trends.  Thank you. Ailene Ards, RD, LDN, CDE Inpatient Diabetes Coordinator [phone]

## 2021-05-25 NOTE — BHH Group Notes (Signed)
Patient did not attend the Psycho-Ed group. 

## 2021-05-25 NOTE — BHH Group Notes (Signed)
Pt was able to attend AA/NA group. Pt was appropriate during tonight's group discussion wrap up group.  

## 2021-05-25 NOTE — BHH Counselor (Addendum)
CSW provided the Pt with a list of housing resources that he and his Steffanie Rainwater can use once he is discharged from the hospital.  CSW provided these resources to the Pt in a brown envelope.

## 2021-05-25 NOTE — Group Note (Signed)
Occupational Therapy Group Note  Group Topic:Communication  Group Date: 05/25/2021 Start Time: 1400 End Time: 1445 Facilitators: Khamari Sheehan, OT/L   Group Description: Group encouraged increased engagement and participation through discussion focused on communication styles. Patients were educated on the different styles of communication including passive, aggressive, assertive, and passive-aggressive communication. Group members shared and reflected on which styles they most often find themselves communicating in and brainstormed strategies on how to transition and practice a more assertive approach. Further discussion explored how to use assertiveness skills and strategies to further advocate and ask questions as it relates to their treatment plan and mental health.   Therapeutic Goal(s): Identify practical strategies to improve communication skills  Identify how to use assertive communication skills to address individual needs and wants   Participation Level: OT Group not held d/t high volume of individual OT/PT orders that needed to be seen.    Plan: Continue to engage patient in OT groups 2 - 3x/week.  05/25/2021  Jomel Whittlesey, OT/L   

## 2021-05-25 NOTE — BH IP Treatment Plan (Signed)
Interdisciplinary Treatment and Diagnostic Plan Update  05/25/2021 Time of Session: 2:05pm Leon Ross MRN: [id number]  Principal Diagnosis: Schizoaffective disorder, bipolar type ([place])  Secondary Diagnoses: Principal Problem:   Schizoaffective disorder, bipolar type ([place]) Active Problems:   Diabetes mellitus type 2 in obese ([place])   Dyslipidemia   History of posttraumatic stress disorder (PTSD)   GERD (gastroesophageal reflux disease)   Current Medications:  Current Facility-Administered Medications  Medication Dose Route Frequency Provider Last Rate Last Admin   acetaminophen (TYLENOL) tablet 650 mg  650 mg Oral Q6H PRN Lindon Romp A, NP   650 mg at 05/24/21 2010   alum & mag hydroxide-simeth (MAALOX/MYLANTA) 200-200-20 MG/5ML suspension 30 mL  30 mL Oral Q4H PRN Rozetta Nunnery, NP       glyBURIDE (DIABETA) tablet 5 mg  5 mg Oral BID Lindon Romp A, NP   5 mg at 05/25/21 0757   hydrOXYzine (ATARAX/VISTARIL) tablet 25 mg  25 mg Oral TID PRN Rosezetta Schlatter, MD       ibuprofen (ADVIL) tablet 600 mg  600 mg Oral Q6H PRN Bobbitt, Shalon E, NP   600 mg at 05/25/21 1344   insulin aspart (novoLOG) injection 0-6 Units  0-6 Units Subcutaneous TID WC Lindon Romp A, NP   1 Units at 05/24/21 1707   insulin glargine-yfgn (SEMGLEE) injection 20 Units  20 Units Subcutaneous Daily Harlow Asa, MD   20 Units at 05/25/21 0758   lamoTRIgine (LAMICTAL) tablet 150 mg  150 mg Oral Daily Rosezetta Schlatter, MD   150 mg at 05/25/21 0757   magnesium hydroxide (MILK OF MAGNESIA) suspension 30 mL  30 mL Oral Daily PRN Rozetta Nunnery, NP       metFORMIN (GLUCOPHAGE-XR) 24 hr tablet 500 mg  500 mg Oral BID Lindon Romp A, NP   500 mg at 05/25/21 0757   mirtazapine (REMERON) tablet 15 mg  15 mg Oral QHS Rosezetta Schlatter, MD   15 mg at 05/24/21 2140   OLANZapine (ZYPREXA) tablet 15 mg  15 mg Oral QHS Rosezetta Schlatter, MD   15 mg at 05/24/21 2139   pantoprazole (PROTONIX) EC tablet 40 mg  40 mg Oral Daily  Lindon Romp A, NP   40 mg at 05/25/21 0758   rosuvastatin (CRESTOR) tablet 20 mg  20 mg Oral Daily Lindon Romp A, NP   20 mg at 05/25/21 0757   sertraline (ZOLOFT) tablet 100 mg  100 mg Oral QHS Rosezetta Schlatter, MD   100 mg at 05/24/21 2139   traZODone (DESYREL) tablet 150 mg  150 mg Oral QHS Rosezetta Schlatter, MD   150 mg at 05/24/21 2139   PTA Medications: Medications Prior to Admission  Medication Sig Dispense Refill Last Dose   glyBURIDE (DIABETA) 5 MG tablet Take 5 mg by mouth 2 (two) times daily.      lamoTRIgine (LAMICTAL) 150 MG tablet Take 150 mg by mouth daily.      metFORMIN (GLUCOPHAGE-XR) 500 MG 24 hr tablet Take 500 mg by mouth 2 (two) times daily.      mirtazapine (REMERON) 15 MG tablet Take 15 mg by mouth at bedtime.      omeprazole (PRILOSEC) 40 MG capsule Take 40 mg by mouth daily as needed (for acid reflux).      promethazine (PHENERGAN) 25 MG tablet Take 25 mg by mouth every 8 (eight) hours as needed for nausea.      rosuvastatin (CRESTOR) 20 MG tablet Take 20 mg by mouth at  bedtime.      sertraline (ZOLOFT) 100 MG tablet Take 100 mg by mouth at bedtime.      tadalafil (CIALIS) 20 MG tablet Take 1 hour prior to intercourse. (Patient taking differently: Take 20 mg by mouth daily as needed for erectile dysfunction. Take 1 hour prior to intercourse.) 10 tablet 0    testosterone cypionate (DEPOTESTOSTERONE CYPIONATE) 200 MG/ML injection Inject 1 mL (200 mg total) into the muscle every 14 (fourteen) days. 6 mL 0    traZODone (DESYREL) 100 MG tablet Take 400 mg by mouth at bedtime.      TRESIBA FLEXTOUCH 200 UNIT/ML FlexTouch Pen Inject 20 Units into the skin daily.      ZYPREXA 10 MG tablet Take 10 mg by mouth at bedtime.       Patient Stressors: Health problems   Medication change or noncompliance   Traumatic event    Patient Strengths: Ability for insight  Average or above average intelligence  Communication skills  Motivation for treatment/growth  Physical Health   Supportive family/friends   Treatment Modalities: Medication Management, Group therapy, Case management,  1 to 1 session with clinician, Psychoeducation, Recreational therapy.   Physician Treatment Plan for Primary Diagnosis: Schizoaffective disorder, bipolar type ([place]) Long Term Goal(s): Improvement in symptoms so as ready for discharge   Short Term Goals: Ability to identify changes in lifestyle to reduce recurrence of condition will improve Ability to verbalize feelings will improve Ability to disclose and discuss suicidal ideas Compliance with prescribed medications will improve Ability to demonstrate self-control will improve Ability to identify and develop effective coping behaviors will improve  Medication Management: Evaluate patient's response, side effects, and tolerance of medication regimen.  Therapeutic Interventions: 1 to 1 sessions, Unit Group sessions and Medication administration.  Evaluation of Outcomes: Not Met  Physician Treatment Plan for Secondary Diagnosis: Principal Problem:   Schizoaffective disorder, bipolar type ([place]) Active Problems:   Diabetes mellitus type 2 in obese ([facility])   Dyslipidemia   History of posttraumatic stress disorder (PTSD)   GERD (gastroesophageal reflux disease)  Long Term Goal(s): Improvement in symptoms so as ready for discharge   Short Term Goals: Ability to identify changes in lifestyle to reduce recurrence of condition will improve Ability to verbalize feelings will improve Ability to disclose and discuss suicidal ideas Compliance with prescribed medications will improve Ability to demonstrate self-control will improve Ability to identify and develop effective coping behaviors will improve     Medication Management: Evaluate patient's response, side effects, and tolerance of medication regimen.  Therapeutic Interventions: 1 to 1 sessions, Unit Group sessions and Medication administration.  Evaluation of Outcomes: Not  Met   RN Treatment Plan for Primary Diagnosis: Schizoaffective disorder, bipolar type ([place]) Long Term Goal(s): Knowledge of disease and therapeutic regimen to maintain health will improve  Short Term Goals: Ability to remain free from injury will improve, Ability to verbalize frustration and anger appropriately will improve, Ability to demonstrate self-control, Ability to participate in decision making will improve, Ability to verbalize feelings will improve, Ability to identify and develop effective coping behaviors will improve, and Compliance with prescribed medications will improve  Medication Management: RN will administer medications as ordered by provider, will assess and evaluate patient's response and provide education to patient for prescribed medication. RN will report any adverse and/or side effects to prescribing provider.  Therapeutic Interventions: 1 on 1 counseling sessions, Psychoeducation, Medication administration, Evaluate responses to treatment, Monitor vital signs and CBGs as ordered, Perform/monitor CIWA, COWS, AIMS and  Fall Risk screenings as ordered, Perform wound care treatments as ordered.  Evaluation of Outcomes: Not Met   LCSW Treatment Plan for Primary Diagnosis: Schizoaffective disorder, bipolar type ([place]) Long Term Goal(s): Safe transition to appropriate next level of care at discharge, Engage patient in therapeutic group addressing interpersonal concerns.  Short Term Goals: Engage patient in aftercare planning with referrals and resources, Increase social support, Increase ability to appropriately verbalize feelings, Increase emotional regulation, Facilitate acceptance of mental health diagnosis and concerns, and Increase skills for wellness and recovery  Therapeutic Interventions: Assess for all discharge needs, 1 to 1 time with Social worker, Explore available resources and support systems, Assess for adequacy in community support network, Educate family and  significant other(s) on suicide prevention, Complete Psychosocial Assessment, Interpersonal group therapy.  Evaluation of Outcomes: Not Met   Progress in Treatment: Attending groups: Yes. Participating in groups: Yes. Taking medication as prescribed: Yes. Toleration medication: Yes. Family/Significant other contact made: No, will contact:  brother or fiance Patient understands diagnosis: Yes. Discussing patient identified problems/goals with staff: Yes. Medical problems stabilized or resolved: Yes. Denies suicidal/homicidal ideation: Yes. Issues/concerns per patient self-inventory: No.  New problem(s) identified: No, Describe:  none  New Short Term/Long Term Goal(s): medication stabilization, elimination of SI thoughts, development of comprehensive mental wellness plan.    Patient Goals:  "Get my head back together"  Discharge Plan or Barriers: Pt is to follow up with [facility] for medication management and [facility] for therapy.  Reason for Continuation of Hospitalization: Anxiety Depression Medication stabilization Suicidal ideation  Estimated Length of Stay: 3-5 days   Scribe for Treatment Team: Vassie Moselle, LCSW 05/25/2021 2:33 PM

## 2021-05-25 NOTE — Progress Notes (Signed)
D:  Leon Ross was up and visible on the unit.  He attended evening AA group.  He denied SI/HI but continues to report AVH.  He rated his day 6/10 (10 the best) and stated it was due to the fact that her was able to speak with his sister in [place].  He is planning staying with her for two weeks to visit with family.  Rated depression 5/10 and anxiety 7/10 (10 the worst). A:  1:1 with RN for support and encouragement.  Medications given as ordered.  Q 15 minute checks maintained for safety.  Encouraged participation in group and unit activities.   R:  He is currently resting with his eyes closed and appears to be asleep.  He remains safe on the unit.  We will continue to monitor the progress towards his goals.

## 2021-05-25 NOTE — Group Note (Signed)
LCSW Group Therapy Note   Group Date: 05/25/2021 Start Time: 1300 End Time: 1400   Type of Therapy and Topic:  Group Therapy:   Participation Level:  Active  Patients received a worksheet with an outline of 2 gingerbread men with a separation in the middle of the page. One sign designated what the pt sees about themselves and the other is what others see. Pts were asked to introduce themselves and share something they like about themself. Pts were then asked to draw, write or color how they view themselves as well as how they are viewed by others. CSW led discussion about the feelings and words associated with each side.   Patient Summary:   During introductions pt shared their name and stated they liked their sense of humor about themselves. Pt was appropriate and participated in group discussion.   Aram Beecham, LCSWA 05/25/2021  1:42 PM

## 2021-05-25 NOTE — Group Note (Signed)
Recreation Therapy Group Note   Group Topic:Stress Management  Group Date: 05/25/2021 Start Time: 0935 End Time: 0950 Facilitators: Caroll Rancher, LRT/CTRS Location: [address] Dayroom  Goal Area(s) Addresses:  Patient will actively participate in stress management techniques presented during session.  Patient will successfully identify benefit of practicing stress management post d/c.   Group Description: Guided Imagery. LRT provided education, instruction, and demonstration on practice of visualization via guided imagery. Patient was asked to participate in the technique introduced during session. LRT debriefed including topics of mindfulness, stress management and specific scenarios each patient could use these techniques. Patients were given suggestions of ways to access scripts post d/c and encouraged to explore Youtube and other apps available on smartphones, tablets, and computers.   Affect/Mood: N/A   Participation Level: Did not attend    Clinical Observations/Individualized Feedback: Pt did not attend group.     Plan: Continue to engage patient in RT group sessions 2-3x/week.   Caroll Rancher, LRT/CTRS 05/25/2021 12:23 PM

## 2021-05-25 NOTE — Progress Notes (Addendum)
   05/25/21 1100  Psych Admission Type (Psych Patients Only)  Admission Status Voluntary  Psychosocial Assessment  Patient Complaints Depression  Eye Contact Brief  Facial Expression Flat  Affect Appropriate to circumstance  Speech Logical/coherent  Interaction Assertive  Motor Activity Other (Comment) (WDL)  Appearance/Hygiene In scrubs  Behavior Characteristics Cooperative;Appropriate to situation  Mood Depressed  Thought Process  Coherency Concrete thinking  Content WDL  Delusions None reported or observed  Perception Hallucinations  Hallucination Auditory;Visual  Judgment Poor  Confusion None  Danger to Self  Current suicidal ideation? Denies  Danger to Others  Danger to Others None reported or observed  D: Pt presents with a depressed affect/ mood- calm, cooperative, but somewhat guarded behavior. Pt reported poor sleep last night because he wasn't able to have the [facility] raised as he was moved to a non-medical bed.  Pt reported that he has sleep apnea, but declined to have someone bring in his CPAP machine. Pt denied SI/HI and stated that his A/VH are improving.  A: VS and labs monitored. Pt given scheduled meds and ibuprofen and ice pack for 7/10 right knee pain R. Pt remains safe with 15 minute checks. Will continue POC.
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
## 2021-05-25 NOTE — Progress Notes (Addendum)
PT/OT / Rehab Cancellation Note  Patient Details Name: Leon Ross MRN: [id number] DOB: August 29, 1970   Cancelled Treatment:    Reason Eval/Treat Not Completed: OT screened, no needs identified, will sign off  Leon Ross is a 50 y/o male with a PMHx of schizoaffective disorder bipolar type and PTSD who was admitted voluntarily for worsening depression with SI, anxiety, and command auditory hallucinations telling him to kill himself. Rehab order placed to address knee pain. Pt able to ambulate independently without use of assistive device, pain improves/managed with medication. No acute OT needs identified, will sign off.     [name] 05/25/2021, 3:48 PM  PT/ NOTE:  Spoke with Rodman Pickle about the order. [facility] screens for Acute PT/OT needs, and as written above no Acute rehab needs for this patient. If he continues to have knee pain, recommend f/u with OP MD or OP rehab services.   Thank you,  Mercy Moore, PT, MPT Acute Rehabilitation Services Office: [phone] Pager: [phone] 05/25/2021
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
## 2021-05-26 LAB — GLUCOSE, CAPILLARY
Glucose-Capillary: 93 mg/dL (ref 70–99)
Glucose-Capillary: 136 mg/dL — ABNORMAL HIGH (ref 70–99)
Glucose-Capillary: 147 mg/dL — ABNORMAL HIGH (ref 70–99)
Glucose-Capillary: 223 mg/dL — ABNORMAL HIGH (ref 70–99)
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
## 2021-05-26 NOTE — Plan of Care (Signed)
Nurse discussed depression and coping skills with patient.  

## 2021-05-26 NOTE — Progress Notes (Signed)
   05/26/21 2020  Psych Admission Type (Psych Patients Only)  Admission Status Voluntary  Psychosocial Assessment  Patient Complaints Anxiety;Depression  Eye Contact Fair  Facial Expression Anxious  Affect Appropriate to circumstance  Speech Logical/coherent  Interaction Assertive  Motor Activity Slow  Appearance/Hygiene Unremarkable  Behavior Characteristics Cooperative;Appropriate to situation;Anxious  Mood Anxious;Depressed;Pleasant  Thought Process  Coherency Concrete thinking  Content WDL  Delusions None reported or observed  Perception Hallucinations  Hallucination Auditory;Visual (seeing shadows; command auditory hallucinations)  Judgment Poor  Confusion None  Danger to Self  Current suicidal ideation? Denies  Danger to Others  Danger to Others None reported or observed   Pt seen in milieu in dayroom. Pt denies SI, HI. Endorses AVH as "seeing shadows and hearing voices tell me I'm no good and worthless." Pt says he counters the voices by distracting himself with sleep or watching television. Pt endorses pain 7/10 in his right knee. Says he has a torn meniscus. Pt rates anxiety 7/10 and depression 5/10. Pt misses his dog who has to have surgery for an infected dew claw. Pt is a Education officer, environmental and loves animals.

## 2021-05-26 NOTE — Progress Notes (Signed)
D:  Patient denied SI and HI, contracts for safety.  Stated he does  hear voices that he is no good, etc.  Also does see shadows in the corner sometimes.  Has poor sleep, no sleep medication.  Fair appetite, normal energy level, poor concentration.  Rated depression and hopeless 5, anxiety 8.  Denied withdrawals.  Physical problems. R knee, worst pain #7 in past 24 hours, pain medicine is helpful.  Goal is get ready for visit.  Plans to stay awake.  No discharge plans. A:  Medications administered per MD orders.  Emotional support and encouragement given patient. R:  Denied SI and HI, contracts for safety.  Does see shadows and hears voices saying negative things about him.  Safety maintained with 15 minute checks.

## 2021-05-26 NOTE — Group Note (Signed)
Recreation Therapy Group Note   Group Topic:Animal Assisted Therapy   Group Date: 05/26/2021 Start Time: 1430 End Time: 1510 Facilitators: Caroll Rancher, LRT/CTRS Location: [address]  AAA/T Program Assumption of Risk Form signed by Patient/ or Parent Legal Guardian Yes  Patient is free of allergies or severe asthma Yes  Patient reports no fear of animals Yes  Patient reports no history of cruelty to animals Yes  Patient understands his/her participation is voluntary Yes  Patient washes hands before animal contact Yes  Patient washes hands after animal contact Yes  Clinical Observations/Feedback:    Leon Ross, LRT/CTRS     Affect/Mood: Appropriate   Participation Level: Engaged   Participation Quality: Independent   Behavior: Appropriate   Speech/Thought Process: Focused   Insight: Good   Judgement: Good   Modes of Intervention: Teaching laboratory technician   Patient Response to Interventions:  Engaged   Education Outcome:  Acknowledges education and In group clarification offered    Clinical Observations/Individualized Feedback: Patient attended session and interacted appropriately with therapy dog and peers. Patient asked appropriate questions about therapy dog and his training. Patient shared stories about their pets at home with group.  Plan: Continue to engage patient in RT group sessions 2-3x/week.   Caroll Rancher, LRT/CTRS 05/26/2021 3:37 PM

## 2021-05-26 NOTE — Progress Notes (Signed)
Adult Psychoeducational Group Note  Date:  05/26/2021 Time:  9:06 PM  Group Topic/Focus:  Wrap-Up Group:   The focus of this group is to help patients review their daily goal of treatment and discuss progress on daily workbooks.  Participation Level:  Active  Participation Quality:  Appropriate  Affect:  Appropriate  Cognitive:  Appropriate  Insight: Appropriate  Engagement in Group:  Improving  Modes of Intervention:  Discussion  Additional Comments:  Pt stated his goal for today was to focus on his treatment plan. Pt stated he accomplished his goal today. Pt stated he talked with his doctor and social worker about his care today. Pt rated his overall day a 6 out of 10. Pt stated he was able to contact his fiancee today which improved his overall day. Pt stated he felt better about himself today. Pt stated he was able to attend all meals. Pt stated he took all medications provided today. Pt stated he attend all groups held today. Pt stated his appetite was improving today. Pt rated sleep last night was poor. Pt stated the goal tonight was to get some rest. Pt stated he had some physical pain today. Pt stated he had some mild pain in his right knee. Pt rated the moderate pain in his right knee a 7 on the pain level scale. Pt nurse was updated on the situation. Pt admitted to dealing with visual hallucinations and auditory issues tonight. Pt nurse was updated on the situation. Pt denies thoughts of harming himself or others. Pt stated he would alert staff if anything changed.  Mateen Franssen 05/26/2021, 9:06 PM

## 2021-05-26 NOTE — [facility] Group Notes (Signed)
The focus of this group is to help patients establish daily goals to achieve during treatment and discuss how the patient can incorporate goal setting into their daily lives to aide in recovery.  Pt did not attend morning goals group.  

## 2021-05-27 LAB — GLUCOSE, CAPILLARY
Glucose-Capillary: 128 mg/dL — ABNORMAL HIGH (ref 70–99)
Glucose-Capillary: 166 mg/dL — ABNORMAL HIGH (ref 70–99)
Glucose-Capillary: 289 mg/dL — ABNORMAL HIGH (ref 70–99)

## 2021-05-27 MED ORDER — OLANZAPINE 10 MG PO TABS
20.0000 mg | ORAL_TABLET | Freq: Every day | ORAL | Status: DC
  Administered 2021-05-27 – 2021-05-28 (×2): 20 mg via ORAL
  Filled 2021-05-27 (×4): qty 2

## 2021-05-27 MED ORDER — OLANZAPINE 10 MG PO TABS
10.0000 mg | ORAL_TABLET | Freq: Every day | ORAL | Status: DC
  Administered 2021-05-27 – 2021-05-28 (×2): 10 mg via ORAL
  Filled 2021-05-27 (×4): qty 1

## 2021-05-27 NOTE — Group Note (Signed)
Group Topic: Goal Setting  Group Date: 05/27/2021 Start Time: 0900 End Time: 1000 Facilitators: Pearline Cables, NT  [facility]  Number of Participants: 10  Group Focus: daily focus Treatment Modality:  Psychoeducation Interventions utilized were orientation and reality testing Purpose: regain self-worth  Name: Emeka Lindner Date of Birth: 1970/10/15  MR: [id number]    Level of Participation: active Quality of Participation: attentive Interactions with others: gave feedback Mood/Affect: appropriate Triggers (if applicable) Cognition: coherent/clear Progress: Moderate Response Plan: follow-up needed  Patients Problems:  Patient Active Problem List   Diagnosis Date Noted   Diabetes mellitus type 2 in obese (HCC) 05/23/2021   Dyslipidemia 05/23/2021   History of posttraumatic stress disorder (PTSD) 05/23/2021   GERD (gastroesophageal reflux disease) 05/23/2021   Schizoaffective disorder, bipolar type (HCC) 05/22/2021
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
## 2021-05-27 NOTE — Plan of Care (Signed)
Nurse discussed anxiety, depression and coping skills with patient.  

## 2021-05-27 NOTE — [facility] Group Notes (Signed)
[facility] Group Notes:  (Nursing/MHT/Case Management/Adjunct)  Date:  05/27/2021  Time:  8:10 PM  Type of Therapy:   NA group[  Participation Level:  Active  Participation Quality:  Appropriate  Affect:  Appropriate  Cognitive:  Appropriate  Insight:  Appropriate  Engagement in Group:  Developing/Improving  Modes of Intervention:  Discussion  Summary of Progress/Problems: Pt was calm and had a good attitude. Pt did well in group.   Lorita Officer 05/27/2021, 8:10 PM

## 2021-05-27 NOTE — Group Note (Signed)
Recreation Therapy Group Note   Group Topic:Stress Management  Group Date: 05/27/2021 Start Time: 0930 End Time: 0945 Facilitators: Caroll Rancher, LRT/CTRS Location: [address] Dayroom  Goal Area(s) Addresses:  Patient will identify positive stress management techniques. Patient will identify benefits of using stress management post d/c.  Group Description:  Meditation.  LRT played a meditation that focused on setting personal boundaries for self care.  The meditation focused on learning to say no for things you don't want to do instead of trying to make others happy at the expense of yourself.    Affect/Mood: N/A   Participation Level: Did not attend    Clinical Observations/Individualized Feedback: Pt did not attend group.    Plan: Continue to engage patient in RT group sessions 2-3x/week.   Caroll Rancher, LRT/CTRS 05/27/2021 11:10 AM

## 2021-05-27 NOTE — Progress Notes (Signed)
DAR Note: Patient denies SI/HI, and endorses +AH of voices telling him that he is "no good", and endorses visual hallucinations of shadows. Affect is blunted and mood is depressed, pt given all meds as scheduled, and is being maintained on Q15 minute checks for safety.   05/27/21 2254  Psych Admission Type (Psych Patients Only)  Admission Status Voluntary  Psychosocial Assessment  Patient Complaints Depression  Eye Contact Fair  Facial Expression Anxious  Affect Appropriate to circumstance  Speech Logical/coherent  Interaction Assertive  Motor Activity Slow  Appearance/Hygiene Unremarkable  Behavior Characteristics Cooperative;Appropriate to situation  Mood Depressed  Thought Process  Coherency Concrete thinking  Content WDL  Delusions None reported or observed  Perception Hallucinations  Hallucination Auditory;Visual (seeing shadows; demeaning voices telling him that he is "no good")  Judgment Poor  Confusion None  Danger to Self  Current suicidal ideation? Denies  Danger to Others  Danger to Others None reported or observed

## 2021-05-27 NOTE — [facility] Group Notes (Signed)
The focus of this group is to help patients establish daily goals to achieve during treatment and discuss how the patient can incorporate goal setting into their daily lives to aide in recovery.  Pt did not attend morning goals group.  

## 2021-05-27 NOTE — Group Note (Signed)
[facility] LCSW Group Therapy Note   Group Date: 05/27/2021 Start Time: 1300 End Time: 1400   Type of Therapy/Topic:  Group Therapy:  Emotion Regulation  Participation Level:  Active    Description of Group:    The purpose of this group is to assist patients in learning to regulate negative emotions and experience positive emotions. Patients will be guided to discuss ways in which they have been vulnerable to their negative emotions. These vulnerabilities will be juxtaposed with experiences of positive emotions or situations, and patients challenged to use positive emotions to combat negative ones. Special emphasis will be placed on coping with negative emotions in conflict situations, and patients will process healthy conflict resolution skills.  Therapeutic Goals: Patient will identify two positive emotions or experiences to reflect on in order to balance out negative emotions:  Patient will label two or more emotions that they find the most difficult to experience:  Patient will be able to demonstrate positive conflict resolution skills through discussion or role plays:   Summary of Patient Progress:  Pt stated that they are feeling apprehensive today.  The Pt accepted the handout of the emotion wheel and discussed various emotions about discharge and being in the hospital.  Pt was appropriate and participated in open discussion with their peers.    Therapeutic Modalities:   Cognitive Behavioral Therapy Feelings Identification Dialectical Behavioral Therapy   Aram Beecham, LCSWA

## 2021-05-28 LAB — GLUCOSE, CAPILLARY
Glucose-Capillary: 120 mg/dL — ABNORMAL HIGH (ref 70–99)
Glucose-Capillary: 156 mg/dL — ABNORMAL HIGH (ref 70–99)
Glucose-Capillary: 156 mg/dL — ABNORMAL HIGH (ref 70–99)
Glucose-Capillary: 288 mg/dL — ABNORMAL HIGH (ref 70–99)

## 2021-05-28 MED ORDER — HYDROXYZINE HCL 25 MG PO TABS
25.0000 mg | ORAL_TABLET | Freq: Every day | ORAL | Status: DC
  Administered 2021-05-28: 25 mg via ORAL
  Filled 2021-05-28 (×4): qty 1

## 2021-05-28 MED ORDER — HYDROXYZINE HCL 25 MG PO TABS: 25.0000 mg | ORAL_TABLET | Freq: Two times a day (BID) | ORAL | Status: DC | PRN

## 2021-05-28 NOTE — [facility] Group Notes (Signed)
Patient did not attend the relaxation group. 
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
## 2021-05-28 NOTE — Progress Notes (Signed)
Psychoeducational Group Note  Date:  05/28/2021 Time:  2015  Group Topic/Focus:  Wrap up group  Participation Level: Did Not Attend  Participation Quality:  Not Applicable  Affect:  Not Applicable  Cognitive:  Not Applicable  Insight:  Not Applicable  Engagement in Group: Not Applicable  Additional Comments:  Did not attend.   Johann Capers S 05/28/2021, 9:01 PM

## 2021-05-28 NOTE — Plan of Care (Signed)
  Problem: Coping: Goal: Ability to use eye contact when communicating with others will improve Outcome: Progressing   Problem: Education: Goal: Knowledge of the prescribed therapeutic regimen will improve Outcome: Progressing   Problem: Self-Concept: Goal: Level of anxiety will decrease Outcome: Not Progressing

## 2021-05-28 NOTE — Progress Notes (Signed)
   05/28/21 0614  Vital Signs  Pulse Rate 96  Pulse Rate Source Monitor  BP 106/78  BP Location Left Arm  BP Method Automatic  Patient Position (if appropriate) Standing  Oxygen Therapy  SpO2 97 %   D: Patient denies SI/HI. Pt reported seeing "shadows". Pt. Complained of right knee pain. Pt. Rated both anxiety and depression 7/10. A:  Patient took scheduled medicine.  Support and encouragement provided Routine safety checks conducted every 15 minutes. Patient  Informed to notify staff with any concerns.   \R:  Safety maintained.

## 2021-05-28 NOTE — Group Note (Signed)
Group Topic: Goal Setting  Group Date: 05/28/2021 Start Time: 0900 End Time: 0930 Facilitators: Donnis Phaneuf, Deforest Hoyles, NT  [facility]  Number of Participants: 5  Group Focus: goals/reality orientation Treatment Modality:  Psychoeducation Interventions utilized were orientation Purpose: reinforce self-care  Name: Leon Ross Date of Birth: 1970/08/22  MR: [id number]    Level of Participation: Did not attend

## 2021-05-28 NOTE — Progress Notes (Signed)
  Pt presents with high anxiety and depression on assessment.  Pt observed sitting in room on his bed.  Pt states, "discharging home tomorrow."  Provided support and explained what to expect during discharge.  Pt denies SI/HI, and verbally contracts for safety.  Pt reports, AH ("voices to kill myself or you're nothing") and VH ("shadows").  Pt remains safe on unit with Q 15 minute safety checks.    05/28/21 2123  Psych Admission Type (Psych Patients Only)  Admission Status Voluntary  Psychosocial Assessment  Patient Complaints Anxiety;Depression  Eye Contact Fair  Facial Expression Anxious  Affect Appropriate to circumstance  Speech Logical/coherent  Interaction Assertive  Motor Activity Slow  Appearance/Hygiene Unremarkable  Behavior Characteristics Cooperative;Appropriate to situation  Mood Depressed;Anxious  Thought Process  Coherency Concrete thinking  Content WDL  Delusions None reported or observed  Perception Hallucinations  Hallucination Auditory;Visual (pt states, "AH-voices to kill myself or you're nothing", "VH - shadows")  Judgment Poor  Confusion None  Danger to Self  Current suicidal ideation? Denies  Danger to Others  Danger to Others None reported or observed

## 2021-05-29 ENCOUNTER — Encounter (HOSPITAL_COMMUNITY): Payer: Self-pay

## 2021-05-29 LAB — GLUCOSE, CAPILLARY
Glucose-Capillary: 114 mg/dL — ABNORMAL HIGH (ref 70–99)
Glucose-Capillary: 140 mg/dL — ABNORMAL HIGH (ref 70–99)

## 2021-05-29 MED ORDER — OLANZAPINE 20 MG PO TABS: 20.0000 mg | ORAL_TABLET | Freq: Every day | ORAL | 0 refills | Status: DC

## 2021-05-29 MED ORDER — TRAZODONE HCL 150 MG PO TABS: 150.0000 mg | ORAL_TABLET | Freq: Every day | ORAL | 0 refills | Status: DC

## 2021-05-29 MED ORDER — HYDROXYZINE HCL 25 MG PO TABS: 25.0000 mg | ORAL_TABLET | Freq: Every day | ORAL | 0 refills | Status: DC

## 2021-05-29 MED ORDER — OLANZAPINE 10 MG PO TABS: 10.0000 mg | ORAL_TABLET | Freq: Every day | ORAL | 0 refills | Status: DC

## 2021-05-29 NOTE — BHH Suicide Risk Assessment (Signed)
[facility] Discharge Suicide Risk Assessment   Principal Problem: Schizoaffective disorder, bipolar type ([facility]) Discharge Diagnoses: Principal Problem:   Schizoaffective disorder, bipolar type ([facility]) Active Problems:   Diabetes mellitus type 2 in obese ([facility])   Dyslipidemia   History of posttraumatic stress disorder (PTSD)   GERD (gastroesophageal reflux disease)   Total Time spent with patient: 20 minutes  Musculoskeletal: Strength & Muscle Tone: within normal limits Gait & Station: normal Patient leans: N/A  Psychiatric Specialty Exam  Presentation  General Appearance: Appropriate for Environment; Casual; Fairly Groomed  Eye Contact:Good  Speech:Clear and Coherent; Normal Rate  Speech Volume:Normal  Handedness:No data recorded  Mood and Affect  Mood:Anxious; Euthymic  Duration of Depression Symptoms: Less than two weeks  Affect:Congruent; Depressed; Restricted   Thought Process  Thought Processes:Coherent; Goal Directed; Linear  Descriptions of Associations:Intact  Orientation:Full (Time, Place and Person)  Thought Content:Logical  History of Schizophrenia/Schizoaffective disorder:Yes  Duration of Psychotic Symptoms:Greater than six months  Hallucinations:Hallucinations: Visual; Auditory Description of Auditory Hallucinations: Negative self talk Description of Visual Hallucinations: Shadows in periphery  Ideas of Reference:None  Suicidal Thoughts:Suicidal Thoughts: No  Homicidal Thoughts:Homicidal Thoughts: No   Sensorium  Memory:Immediate Good; Remote Fair; Recent Fair  Judgment:Good  Insight:Good   Executive Functions  Concentration:Good  Attention Span:Good  Recall:Good  Fund of Knowledge:Good  Language:Good   Psychomotor Activity  Psychomotor Activity:Psychomotor Activity: Normal   Assets  Assets:Communication Skills; Desire for Improvement; Resilience; Social Support; Transportation   Sleep  Sleep:Sleep: Good Number of Hours  of Sleep: 6.75   Physical Exam: Physical Exam Vitals and nursing note reviewed.  Constitutional:      Appearance: Normal appearance.  HENT:     Head: Normocephalic and atraumatic.     Nose: Nose normal.  Eyes:     Extraocular Movements: Extraocular movements intact.  Pulmonary:     Effort: Pulmonary effort is normal.  Musculoskeletal:        General: Normal range of motion.     Cervical back: Normal range of motion.  Neurological:     General: No focal deficit present.     Mental Status: He is alert and oriented to person, place, and time.  Psychiatric:        Mood and Affect: Mood normal.        Behavior: Behavior normal.        Thought Content: Thought content normal.        Judgment: Judgment normal.   Review of Systems  Constitutional:  Negative for fever.  Cardiovascular:  Negative for chest pain.  Musculoskeletal:  Positive for joint pain.  Neurological:  Negative for headaches.  Psychiatric/Behavioral:  Positive for hallucinations. Negative for depression, memory loss, substance abuse and suicidal ideas. The patient does not have insomnia.   Blood pressure 118/80, pulse (!) 107, temperature 97.6 F (36.4 C), resp. rate 18, height 5\' 7"  (1.702 m), weight 119.3 kg, SpO2 97 %. Body mass index is 41.19 kg/m.  Mental Status Per Nursing Assessment::   On Admission:  Suicidal ideation indicated by patient, Self-harm thoughts  Demographic Factors:  Male, Caucasian, and Low socioeconomic status  Loss Factors: Decline in physical health  Historical Factors: Prior suicide attempts and Impulsivity  Risk Reduction Factors:   Sense of responsibility to family, Employed, Living with another person, especially a relative, Positive social support, and Positive therapeutic relationship  Continued Clinical Symptoms:  Schizophrenia:   Depressive state Chronic Pain More than one psychiatric diagnosis Previous Psychiatric Diagnoses and Treatments  Cognitive Features  That  Contribute To Risk:  None    Suicide Risk:  Mild:  Suicidal ideation of limited frequency, intensity, duration, and specificity.  There are no identifiable plans, no associated intent, mild dysphoria and related symptoms, good self-control (both objective and subjective assessment), few other risk factors, and identifiable protective factors, including available and accessible social support.   Follow-up Information     [facility] Follow up on 06/02/2021.   Why: You have been scheduled for a medication management appointment on 06/02/2021 at 9:00am. Contact information: [address] [place] [address] [phone]         [facility]. Go on 06/03/2021.   Why: You have an appointment for therapy services on 06/03/21 at 3:00 pm.  This appointment will be held in person. Contact information: [address], [address],  [place], [place] [address]  P: [phone] F: [phone]                Plan Of Care/Follow-up recommendations:  Activity:  as tolerated Diet:  cardiac, diabetic/consistent carbohydrate  Prescriptions for new medications provided for the patient to bridge to follow up appointment. The patient was informed that refills for these prescriptions are generally not provided, and patient is encouraged to attend all follow up appointments to address medication refills and adjustments.   Today's discharge was reviewed with treatment team, and the team is in agreement that the patient is ready for discharge. The patient is was of the discharge plan for today and has been given opportunity to ask questions. At time of discharge, the patient does not vocalize any acute harm to self or others, is goal directed, able to advocate for self and organizational baseline.   At discharge, the patient is instructed to:  Take all medications as prescribed. Report any adverse effects and or reactions from the medicines to her outpatient  provider promptly.  Do not engage in alcohol and/or illegal drug use while on prescription medicines.  In the event of worsening symptoms, patient is instructed to call the crisis hotline, 911 and or go to the nearest ED for appropriate evaluation and treatment of symptoms.  Follow-up with primary care provider for further care of medical issues, concerns and or health care needs.   Roselle Locus, MD 05/29/2021, 10:45 AM

## 2021-05-29 NOTE — Group Note (Signed)
Recreation Therapy Group Note   Group Topic:Stress Management  Group Date: 05/29/2021 Start Time: 0930 End Time: 0945 Facilitators: Caroll Rancher, LRT/CTRS Location: [address] Dayroom  Goal Area(s) Addresses:  Patient will actively participate in stress management techniques presented during session.  Patient will successfully identify benefit of practicing stress management post d/c.   Group Description: Meditation.  LRT played a meditation that focused on looking at each day as a new beginning to try new things, make a new friend, take time for yourself or make peace with any individual you may have an issue with.  Patients were to listen and follow along as meditation played to engage in activity.   Affect/Mood: N/A   Participation Level: Did not attend    Clinical Observations/Individualized Feedback: Pt did not attend group.    Plan: Continue to engage patient in RT group sessions 2-3x/week.   Caroll Rancher, LRT/CTRS 05/29/2021 11:14 AM
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
## 2021-05-29 NOTE — Progress Notes (Signed)
  [facility] Case Management Discharge Plan :  Will you be returning to the same living situation after discharge:  Yes,  to home At discharge, do you have transportation home?: No. Safe Transport to be arranged Do you have the ability to pay for your medications: Yes,  has insurance  Release of information consent forms completed and in the chart;  Patient's signature needed at discharge.  Patient to Follow up at:  Follow-up Information     [facility] Follow up on 06/02/2021.   Why: You have been scheduled for a medication management appointment on 06/02/2021 at 9:00am. Contact information: [address] [place] [address] [phone]         [facility]. Go on 06/03/2021.   Why: You have an appointment for therapy services on 06/03/21 at 3:00 pm.  This appointment will be held in person. Contact information: [address], [address],  [place], [place] [address]  P: [phone] F: [phone]                Next level of care provider has access to [facility] Link:no  Safety Planning and Suicide Prevention discussed: Yes,  with fiance     Has patient been referred to the Quitline?: Patient refused referral  Patient has been referred for addiction treatment: Pt. refused referral  Otelia Santee, LCSW 05/29/2021, 10:20 AM

## 2021-05-29 NOTE — Progress Notes (Signed)
D: Pt A & O X 4. Denies SI, HI, AVH and pain at this time. D/C home as ordered. Picked up in front of lobby by [employer]. A: D/C instructions reviewed with pt including prescriptions and follow up appointments; compliance encouraged. All belongings from assigned locker given to pt at time of departure. Scheduled and PRN medications given with verbal education and effects monitored. Safety checks maintained without incident till time of d/c.  R: Pt receptive to care. Compliant with medications when offered. Denies adverse drug reactions when assessed. Verbalized understanding related to d/c instructions. Signed belonging sheet in agreement with items received from locker. Ambulatory with a steady gait. Appears to be in no physical distress at time of departure.

## 2021-05-29 NOTE — Group Note (Signed)
LCSW Group Therapy Note  Group Date: 05/29/2021 Start Time: 1300 End Time: 1330   Type of Therapy and Topic:  Group Therapy - Healthy vs Unhealthy Coping Skills  Participation Level:  Active   Description of Group The focus of this group was to determine what unhealthy coping techniques typically are used by group members and what healthy coping techniques would be helpful in coping with various problems. Patients were guided in becoming aware of the differences between healthy and unhealthy coping techniques. Patients were asked to identify 2-3 healthy coping skills they would like to learn to use more effectively.  Therapeutic Goals Patients learned that coping is what human beings do all day long to deal with various situations in their lives Patients defined and discussed healthy vs unhealthy coping techniques Patients identified their preferred coping techniques and identified whether these were healthy or unhealthy Patients determined 2-3 healthy coping skills they would like to become more familiar with and use more often. Patients provided support and ideas to each other   Summary of Patient Progress:   Due to the acuity and complex discharge plans, group was not held. Patient was provided therapeutic worksheets and asked to meet with CSW as needed.    Therapeutic Modalities Cognitive Behavioral Therapy Motivational Interviewing  Chrys Racer 05/29/2021  1:11 PM

## 2021-05-29 NOTE — BH IP Treatment Plan (Signed)
Interdisciplinary Treatment and Diagnostic Plan Update  05/29/2021 Time of Session:  Yehonatan Grandison MRN: [id number]  Principal Diagnosis: Schizoaffective disorder, bipolar type [facility])  Secondary Diagnoses: Principal Problem:   Schizoaffective disorder, bipolar type (HCC) Active Problems:   Diabetes mellitus type 2 in obese (HCC)   Dyslipidemia   History of posttraumatic stress disorder (PTSD)   GERD (gastroesophageal reflux disease)   Current Medications:  Current Facility-Administered Medications  Medication Dose Route Frequency Provider Last Rate Last Admin   acetaminophen (TYLENOL) tablet 650 mg  650 mg Oral Q6H PRN Nira Conn A, NP   650 mg at 05/24/21 2010   alum & mag hydroxide-simeth (MAALOX/MYLANTA) 200-200-20 MG/5ML suspension 30 mL  30 mL Oral Q4H PRN Nira Conn A, NP       glyBURIDE (DIABETA) tablet 5 mg  5 mg Oral BID Nira Conn A, NP   5 mg at 05/29/21 6295   hydrOXYzine (ATARAX/VISTARIL) tablet 25 mg  25 mg Oral BID PRN Lamar Sprinkles, MD       hydrOXYzine (ATARAX/VISTARIL) tablet 25 mg  25 mg Oral QHS Lamar Sprinkles, MD   25 mg at 05/28/21 2123   ibuprofen (ADVIL) tablet 600 mg  600 mg Oral Q6H PRN Bobbitt, Shalon E, NP   600 mg at 05/29/21 0814   insulin aspart (novoLOG) injection 0-6 Units  0-6 Units Subcutaneous TID WC Nira Conn A, NP   1 Units at 05/28/21 1832   insulin glargine-yfgn (SEMGLEE) injection 20 Units  20 Units Subcutaneous Daily Comer Locket, MD   20 Units at 05/29/21 0815   lamoTRIgine (LAMICTAL) tablet 150 mg  150 mg Oral Daily Lamar Sprinkles, MD   150 mg at 05/29/21 0815   magnesium hydroxide (MILK OF MAGNESIA) suspension 30 mL  30 mL Oral Daily PRN Jackelyn Poling, NP       metFORMIN (GLUCOPHAGE-XR) 24 hr tablet 500 mg  500 mg Oral BID Nira Conn A, NP   500 mg at 05/29/21 0814   mirtazapine (REMERON) tablet 15 mg  15 mg Oral QHS Lamar Sprinkles, MD   15 mg at 05/28/21 2122   OLANZapine (ZYPREXA) tablet 10 mg  10 mg Oral QPC  supper Lamar Sprinkles, MD   10 mg at 05/28/21 1832   OLANZapine (ZYPREXA) tablet 20 mg  20 mg Oral QHS Lamar Sprinkles, MD   20 mg at 05/28/21 2122   pantoprazole (PROTONIX) EC tablet 40 mg  40 mg Oral Daily Nira Conn A, NP   40 mg at 05/29/21 0814   rosuvastatin (CRESTOR) tablet 20 mg  20 mg Oral Daily Nira Conn A, NP   20 mg at 05/29/21 2841   sertraline (ZOLOFT) tablet 100 mg  100 mg Oral QHS Lamar Sprinkles, MD   100 mg at 05/28/21 2123   traZODone (DESYREL) tablet 150 mg  150 mg Oral QHS Lamar Sprinkles, MD   150 mg at 05/28/21 2122   PTA Medications: Medications Prior to Admission  Medication Sig Dispense Refill Last Dose   glyBURIDE (DIABETA) 5 MG tablet Take 5 mg by mouth 2 (two) times daily.      lamoTRIgine (LAMICTAL) 150 MG tablet Take 150 mg by mouth daily.      metFORMIN (GLUCOPHAGE-XR) 500 MG 24 hr tablet Take 500 mg by mouth 2 (two) times daily.      mirtazapine (REMERON) 15 MG tablet Take 15 mg by mouth at bedtime.      omeprazole (PRILOSEC) 40 MG capsule Take 40 mg by mouth  daily as needed (for acid reflux).      promethazine (PHENERGAN) 25 MG tablet Take 25 mg by mouth every 8 (eight) hours as needed for nausea.      rosuvastatin (CRESTOR) 20 MG tablet Take 20 mg by mouth at bedtime.      sertraline (ZOLOFT) 100 MG tablet Take 100 mg by mouth at bedtime.      tadalafil (CIALIS) 20 MG tablet Take 1 hour prior to intercourse. (Patient taking differently: Take 20 mg by mouth daily as needed for erectile dysfunction. Take 1 hour prior to intercourse.) 10 tablet 0    testosterone cypionate (DEPOTESTOSTERONE CYPIONATE) 200 MG/ML injection Inject 1 mL (200 mg total) into the muscle every 14 (fourteen) days. 6 mL 0    traZODone (DESYREL) 100 MG tablet Take 400 mg by mouth at bedtime.      TRESIBA FLEXTOUCH 200 UNIT/ML FlexTouch Pen Inject 20 Units into the skin daily.      ZYPREXA 10 MG tablet Take 10 mg by mouth at bedtime.       Patient Stressors: Health problems    Medication change or noncompliance   Traumatic event    Patient Strengths: Ability for insight  Average or above average intelligence  Communication skills  Motivation for treatment/growth  Physical Health  Supportive family/friends   Treatment Modalities: Medication Management, Group therapy, Case management,  1 to 1 session with clinician, Psychoeducation, Recreational therapy.   Physician Treatment Plan for Primary Diagnosis: Schizoaffective disorder, bipolar type (HCC) Long Term Goal(s): Improvement in symptoms so as ready for discharge   Short Term Goals: Ability to identify changes in lifestyle to reduce recurrence of condition will improve Ability to verbalize feelings will improve Ability to disclose and discuss suicidal ideas Compliance with prescribed medications will improve Ability to demonstrate self-control will improve Ability to identify and develop effective coping behaviors will improve  Medication Management: Evaluate patient's response, side effects, and tolerance of medication regimen.  Therapeutic Interventions: 1 to 1 sessions, Unit Group sessions and Medication administration.  Evaluation of Outcomes: Adequate for Discharge  Physician Treatment Plan for Secondary Diagnosis: Principal Problem:   Schizoaffective disorder, bipolar type (HCC) Active Problems:   Diabetes mellitus type 2 in obese (HCC)   Dyslipidemia   History of posttraumatic stress disorder (PTSD)   GERD (gastroesophageal reflux disease)  Long Term Goal(s): Improvement in symptoms so as ready for discharge   Short Term Goals: Ability to identify changes in lifestyle to reduce recurrence of condition will improve Ability to verbalize feelings will improve Ability to disclose and discuss suicidal ideas Compliance with prescribed medications will improve Ability to demonstrate self-control will improve Ability to identify and develop effective coping behaviors will improve      Medication Management: Evaluate patient's response, side effects, and tolerance of medication regimen.  Therapeutic Interventions: 1 to 1 sessions, Unit Group sessions and Medication administration.  Evaluation of Outcomes: Adequate for Discharge   RN Treatment Plan for Primary Diagnosis: Schizoaffective disorder, bipolar type (HCC) Long Term Goal(s): Knowledge of disease and therapeutic regimen to maintain health will improve  Short Term Goals: Ability to demonstrate self-control, Ability to participate in decision making will improve, and Ability to verbalize feelings will improve  Medication Management: RN will administer medications as ordered by provider, will assess and evaluate patient's response and provide education to patient for prescribed medication. RN will report any adverse and/or side effects to prescribing provider.  Therapeutic Interventions: 1 on 1 counseling sessions, Psychoeducation, Medication administration, Evaluate responses  to treatment, Monitor vital signs and CBGs as ordered, Perform/monitor CIWA, COWS, AIMS and Fall Risk screenings as ordered, Perform wound care treatments as ordered.  Evaluation of Outcomes: Adequate for Discharge   LCSW Treatment Plan for Primary Diagnosis: Schizoaffective disorder, bipolar type (HCC) Long Term Goal(s): Safe transition to appropriate next level of care at discharge, Engage patient in therapeutic group addressing interpersonal concerns.  Short Term Goals: Engage patient in aftercare planning with referrals and resources, Increase social support, and Increase ability to appropriately verbalize feelings  Therapeutic Interventions: Assess for all discharge needs, 1 to 1 time with Social worker, Explore available resources and support systems, Assess for adequacy in community support network, Educate family and significant other(s) on suicide prevention, Complete Psychosocial Assessment, Interpersonal group therapy.  Evaluation  of Outcomes: Adequate for Discharge   Progress in Treatment: Attending groups: Yes. and No. Participating in groups: Yes. and No. Taking medication as prescribed: Yes. Toleration medication: Yes. Family/Significant other contact made: Yes, individual(s) contacted:  girlfriend Patient understands diagnosis: Yes. Discussing patient identified problems/goals with staff: Yes. Medical problems stabilized or resolved: Yes. Denies suicidal/homicidal ideation: Yes. Issues/concerns per patient self-inventory: No. Other: None  New problem(s) identified: No, Describe:  None  New Short Term/Long Term Goal(s):medication stabilization, elimination of SI thoughts, development of comprehensive mental wellness plan.   Patient Goals:  "get my head back together."  Discharge Plan or Barriers: Pt will follow up for medication management at [facility] in [place] and therapy at [facility].   Reason for Continuation of Hospitalization: Medication stabilization  Estimated Length of Stay: 3-5 days   Scribe for Treatment Team: Chrys Racer 05/29/2021 10:37 AM

## 2021-05-29 NOTE — BHH Group Notes (Signed)
Pt did not attend goals group. 

## 2021-06-09 ENCOUNTER — Ambulatory Visit: Payer: 59 | Admitting: Orthopedic Surgery

## 2021-08-25 ENCOUNTER — Ambulatory Visit: Payer: 59 | Admitting: Internal Medicine

## 2021-09-23 ENCOUNTER — Ambulatory Visit: Payer: 59 | Admitting: Internal Medicine

## 2021-11-04 ENCOUNTER — Ambulatory Visit (INDEPENDENT_AMBULATORY_CARE_PROVIDER_SITE_OTHER): Payer: 59 | Admitting: Internal Medicine

## 2021-11-04 ENCOUNTER — Encounter: Payer: Self-pay | Admitting: Internal Medicine

## 2021-11-04 VITALS — BP 100/68 | HR 83 | Ht 67.5 in | Wt 239.0 lb

## 2021-11-04 DIAGNOSIS — R197 Diarrhea, unspecified: Secondary | ICD-10-CM

## 2021-11-04 DIAGNOSIS — K219 Gastro-esophageal reflux disease without esophagitis: Secondary | ICD-10-CM

## 2021-11-04 DIAGNOSIS — R112 Nausea with vomiting, unspecified: Secondary | ICD-10-CM
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
## 2021-11-04 NOTE — Patient Instructions (Signed)
If you are age 51 or older, your body mass index should be between 23-30. Your Body mass index is 36.88 kg/m?Marland Kitchen If this is out of the aforementioned range listed, please consider follow up with your Primary Care Provider. ? ?If you are age 2 or younger, your body mass index should be between 19-25. Your Body mass index is 36.88 kg/m?Marland Kitchen If this is out of the aformentioned range listed, please consider follow up with your Primary Care Provider.  ? ?________________________________________________________ ? ?The [place] [facility] providers would like to encourage you to use [facility] to communicate with providers for non-urgent requests or questions.  Due to long hold times on the telephone, sending your provider a message by [facility] may be a faster and more efficient way to get a response.  Please allow 48 business hours for a response.  Please remember that this is for non-urgent requests.  ?_______________________________________________________ ? ?You have been scheduled for an endoscopy. Please follow written instructions given to you at your visit today. ?If you use inhalers (even only as needed), please bring them with you on the day of your procedure. ? ?You will be contacted by [facility] Scheduling in the next 2 days to arrange a gastric emptying scan.  The number on your caller ID will be [phone], please answer when they call.  If you have not heard from them in 2 days please call [phone] to schedule.   ? ?

## 2021-11-24 ENCOUNTER — Ambulatory Visit (AMBULATORY_SURGERY_CENTER): Payer: 59 | Admitting: Internal Medicine

## 2021-11-24 ENCOUNTER — Other Ambulatory Visit: Payer: Self-pay | Admitting: Internal Medicine

## 2021-11-24 ENCOUNTER — Encounter: Payer: Self-pay | Admitting: Internal Medicine

## 2021-11-24 VITALS — BP 112/72 | HR 73 | Temp 97.7°F | Resp 14 | Ht 67.0 in | Wt 239.0 lb

## 2021-11-24 DIAGNOSIS — R112 Nausea with vomiting, unspecified: Secondary | ICD-10-CM | POA: Diagnosis not present

## 2021-11-24 DIAGNOSIS — K253 Acute gastric ulcer without hemorrhage or perforation: Secondary | ICD-10-CM | POA: Diagnosis not present

## 2021-11-24 DIAGNOSIS — K259 Gastric ulcer, unspecified as acute or chronic, without hemorrhage or perforation: Secondary | ICD-10-CM

## 2021-11-24 DIAGNOSIS — K2100 Gastro-esophageal reflux disease with esophagitis, without bleeding: Secondary | ICD-10-CM | POA: Diagnosis not present

## 2021-11-24 DIAGNOSIS — K219 Gastro-esophageal reflux disease without esophagitis: Secondary | ICD-10-CM

## 2021-11-24 MED ORDER — PANTOPRAZOLE SODIUM 40 MG PO TBEC: 40.0000 mg | DELAYED_RELEASE_TABLET | Freq: Every day | ORAL | 11 refills | Status: DC

## 2021-11-24 MED ORDER — SODIUM CHLORIDE 0.9 % IV SOLN
500.0000 mL | Freq: Once | INTRAVENOUS | Status: DC
Start: 2021-11-24 — End: 2021-11-24

## 2021-11-24 NOTE — Progress Notes (Signed)
Called to room to assist during endoscopic procedure.  Patient ID and intended procedure confirmed with present staff. Received instructions for my participation in the procedure from the performing physician.  

## 2021-11-24 NOTE — Progress Notes (Signed)
Sedate, gd SR, tolerated procedure well, VSS, report to RN 

## 2021-11-24 NOTE — Op Note (Signed)
[facility] ?Patient Name: Leon Ross ?Procedure Date: 11/24/2021 9:34 AM ?MRN: [id number] ?Endoscopist: Wilhemina Bonito. Marina Goodell , MD ?Age: 51 ?Referring MD:  ?Date of Birth: 1970/09/06 ?Gender: Male ?Account #: [id number] ?Procedure:                Upper GI endoscopy with biopsies ?Indications:              Esophageal reflux, nausea with postprandial gagging ?Medicines:                Monitored Anesthesia Care ?Procedure:                Pre-Anesthesia Assessment: ?                          - Prior to the procedure, a History and Physical  ?                          was performed, and patient medications and  ?                          allergies were reviewed. The patient's tolerance of  ?                          previous anesthesia was also reviewed. The risks  ?                          and benefits of the procedure and the sedation  ?                          options and risks were discussed with the patient.  ?                          All questions were answered, and informed consent  ?                          was obtained. Prior Anticoagulants: The patient has  ?                          taken no previous anticoagulant or antiplatelet  ?                          agents. ASA Grade Assessment: II - A patient with  ?                          mild systemic disease. After reviewing the risks  ?                          and benefits, the patient was deemed in  ?                          satisfactory condition to undergo the procedure. ?                          After obtaining informed consent, the endoscope was  ?  passed under direct vision. Throughout the  ?                          procedure, the patient's blood pressure, pulse, and  ?                          oxygen saturations were monitored continuously. The  ?                          GIF [id number] #[id number] was introduced through the  ?                          mouth, and advanced to the second part of duodenum.  ?                           The upper GI endoscopy was accomplished without  ?                          difficulty. The patient tolerated the procedure  ?                          well. ?Scope In: ?Scope Out: ?Findings:                 The esophagus revealed mild esophagitis as  ?                          manifested by erythema, edema and friability in the  ?                          region of the gastroesophageal junction. ?                          The stomach revealed several antral erosions.  ?                          Biopsies were taken with a cold forceps for  ?                          histology. Stomach was otherwise normal ?                          The examined duodenum was normal. ?                          The cardia and gastric fundus were normal on  ?                          retroflexion. ?Complications:            No immediate complications. ?Estimated Blood Loss:     Estimated blood loss: none. ?Impression:               1 GERD with esophagitis ?                          2. Antral erosions ?  3. Otherwise unremarkable EGD. ?Recommendation:           1. Patient has a contact number available for  ?                          emergencies. The signs and symptoms of potential  ?                          delayed complications were discussed with the  ?                          patient. Return to normal activities tomorrow.  ?                          Written discharge instructions were provided to the  ?                          patient. ?                          2. Resume previous diet. ?                          3. Continue present medications. ?                          4. Await pathology results. ?                          5. Prescribe PANTOPRAZOLE 40 mg daily; #30; 11  ?                          refills. Take 1 DAILY in the morning 30 to 60  ?                          minutes before breakfast ?                          6. Schedule solid-phase gastric emptying scan. Rule  ?                           out gastroparesis ?                          7. GI office follow-up 3 months ?Wilhemina BonitoJohn N. Marina GoodellPerry, MD ?11/24/2021 9:59:29 AM ?This report has been signed electronically. ?

## 2021-11-24 NOTE — Progress Notes (Signed)
VS completed by CW. ? ?Medical history reviewed and updated. ? ?

## 2021-11-24 NOTE — Patient Instructions (Signed)
Resume previous diet and medications. Awaiting pathology results. ?Start Pantoprazole 40 mg daily. Take 1 tablet in the morning 30-60 minutes before breakfast. ? ?Schedule Gastric emptying scan to rule out Gastroparesis. ?Follow up in the office in 3 months. ? ?YOU HAD AN ENDOSCOPIC PROCEDURE TODAY AT THE [facility]:   Refer to the procedure report that was given to you for any specific questions about what was found during the examination.  If the procedure report does not answer your questions, please call your gastroenterologist to clarify.  If you requested that your care partner not be given the details of your procedure findings, then the procedure report has been included in a sealed envelope for you to review at your convenience later. ? ?YOU SHOULD EXPECT: Some feelings of bloating in the abdomen. Passage of more gas than usual.  Walking can help get rid of the air that was put into your GI tract during the procedure and reduce the bloating. If you had a lower endoscopy (such as a colonoscopy or flexible sigmoidoscopy) you may notice spotting of blood in your stool or on the toilet paper. If you underwent a bowel prep for your procedure, you may not have a normal bowel movement for a few days. ? ?Please Note:  You might notice some irritation and congestion in your nose or some drainage.  This is from the oxygen used during your procedure.  There is no need for concern and it should clear up in a day or so. ? ?SYMPTOMS TO REPORT IMMEDIATELY: ? ? ?Following upper endoscopy (EGD) ? Vomiting of blood or coffee ground material ? New chest pain or pain under the shoulder blades ? Painful or persistently difficult swallowing ? New shortness of breath ? Fever of 100?F or higher ? Black, tarry-looking stools ? ?For urgent or emergent issues, a gastroenterologist can be reached at any hour by calling [phone]. ?Do not use MyChart messaging for urgent concerns.  ? ? ?DIET:  We do recommend a small  meal at first, but then you may proceed to your regular diet.  Drink plenty of fluids but you should avoid alcoholic beverages for 24 hours. ? ?ACTIVITY:  You should plan to take it easy for the rest of today and you should NOT DRIVE or use heavy machinery until tomorrow (because of the sedation medicines used during the test).   ? ?FOLLOW UP: ?Our staff will call the number listed on your records 48-72 hours following your procedure to check on you and address any questions or concerns that you may have regarding the information given to you following your procedure. If we do not reach you, we will leave a message.  We will attempt to reach you two times.  During this call, we will ask if you have developed any symptoms of COVID 19. If you develop any symptoms (ie: fever, flu-like symptoms, shortness of breath, cough etc.) before then, please call [phone].  If you test positive for Covid 19 in the 2 weeks post procedure, please call and report this information to [place].   ? ?If any biopsies were taken you will be contacted by phone or by letter within the next 1-3 weeks.  Please call us at [phone] if you have not heard about the biopsies in 3 weeks.  ? ? ?SIGNATURES/CONFIDENTIALITY: ?You and/or your care partner have signed paperwork which will be entered into your electronic medical record.  These signatures attest to the fact that that the information above  on your After Visit Summary has been reviewed and is understood.  Full responsibility of the confidentiality of this discharge information lies with you and/or your care-partner.  ?

## 2021-11-25 ENCOUNTER — Other Ambulatory Visit: Payer: Self-pay

## 2021-11-26 ENCOUNTER — Telehealth: Payer: Self-pay | Admitting: *Deleted

## 2021-11-26 ENCOUNTER — Encounter: Payer: Self-pay | Admitting: Internal Medicine

## 2021-11-26 NOTE — Telephone Encounter (Signed)
?  Follow up Call- ? ? ?  11/24/2021  ?  8:36 AM  ?Call back number  ?Post procedure Call Back phone  # [phone]  ?Permission to leave phone message Yes  ?  ? ?Patient questions: ? ?Do you have a fever, pain , or abdominal swelling? No. ?Pain Score  0 * ? ?Have you tolerated food without any problems? Yes.   ? ?Have you been able to return to your normal activities? Yes.   ? ?Do you have any questions about your discharge instructions: ?Diet   No. ?Medications  No. ?Follow up visit  No. ? ?Do you have questions or concerns about your Care? No. ? ?Actions: ?* If pain score is 4 or above: ?No action needed, pain <4. ? ? ?

## 2021-11-26 NOTE — Telephone Encounter (Signed)
?  Follow up Call- ? ? ?  11/24/2021  ?  8:36 AM  ?Call back number  ?Post procedure Call Back phone  # [phone]  ?Permission to leave phone message Yes  ?  ?LMOM ?

## 2021-12-03 ENCOUNTER — Encounter (HOSPITAL_COMMUNITY)
Admission: RE | Admit: 2021-12-03 | Discharge: 2021-12-03 | Disposition: A | Discharge status: Home / Self Care | Payer: Medicaid Other | Source: Ambulatory Visit | Attending: Internal Medicine | Admitting: Internal Medicine

## 2021-12-03 DIAGNOSIS — K219 Gastro-esophageal reflux disease without esophagitis: Secondary | ICD-10-CM | POA: Insufficient documentation

## 2021-12-03 DIAGNOSIS — R112 Nausea with vomiting, unspecified: Secondary | ICD-10-CM | POA: Insufficient documentation

## 2021-12-03 MED ORDER — TECHNETIUM TC 99M SULFUR COLLOID
2.2000 | Freq: Once | INTRAVENOUS | Status: AC
  Administered 2021-12-03: 2.2 via ORAL

## 2022-03-01 ENCOUNTER — Encounter: Payer: Self-pay | Admitting: Internal Medicine

## 2022-05-21 DIAGNOSIS — F32A Depression, unspecified: Secondary | ICD-10-CM | POA: Insufficient documentation

## 2022-05-21 DIAGNOSIS — E785 Hyperlipidemia, unspecified: Secondary | ICD-10-CM | POA: Insufficient documentation

## 2022-07-22 ENCOUNTER — Ambulatory Visit: Payer: Commercial Managed Care - HMO | Admitting: Physician Assistant

## 2022-07-24 IMAGING — MR MR KNEE*R* W/O CM
6 series · 39 of 40 positions shown · non-contrast
Comparison: X-ray 02/26/2021

CLINICAL DATA: Right knee pain

EXAM:
MRI OF THE RIGHT KNEE WITHOUT CONTRAST
TECHNIQUE: Multiplanar, multisequence MR imaging of the knee was performed. No
intravenous contrast was administered.

[Series 5: T2 fat-sat · axial · right · 3.5mm · 0.50mm/px · z∈[-101,+21]mm · 8 of 33 slices shown (1 of 3)]
[im 1/33]
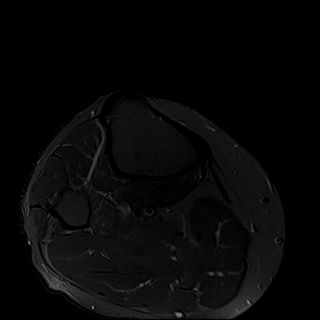
[im 5/33]
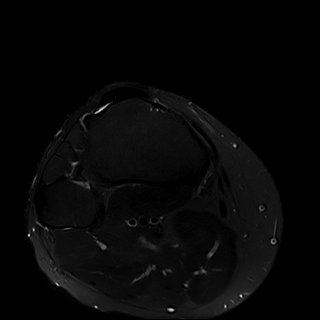
[im 10/33]
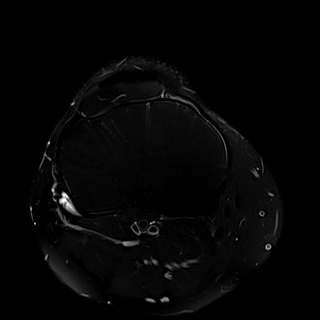
[im 14/33]
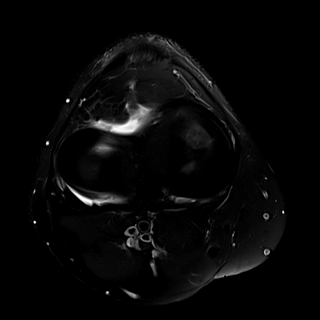
[im 19/33]
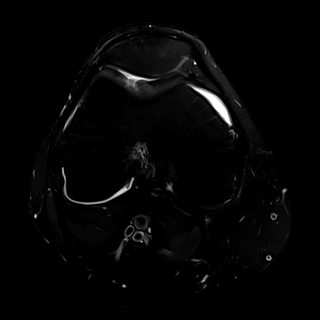
[im 23/33]
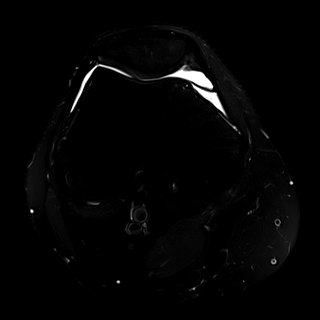
[im 28/33]
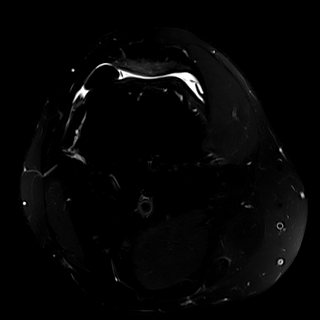
[im 33/33]
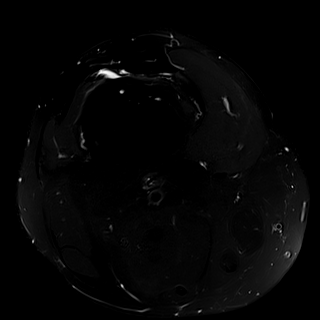

[Series 6: T1 · coronal · right · 4.0mm · 0.29mm/px · 5 of 25 slices shown]
[im 1/25]
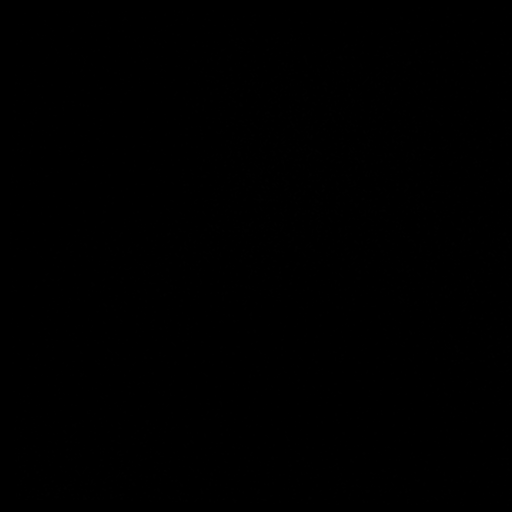
[im 5/25]
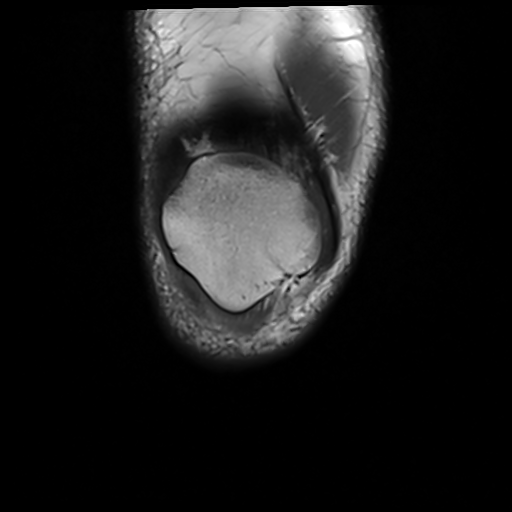
[im 10/25]
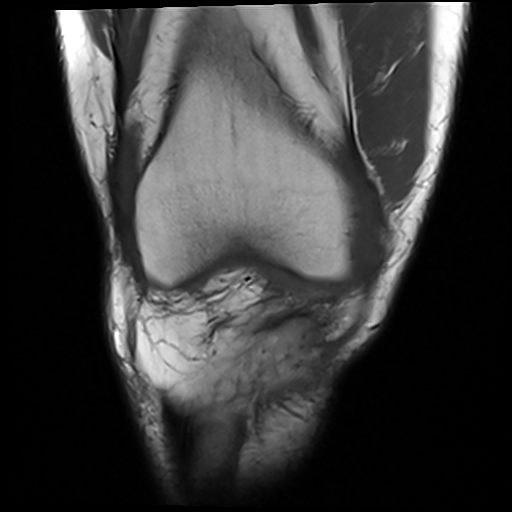
[im 15/25]
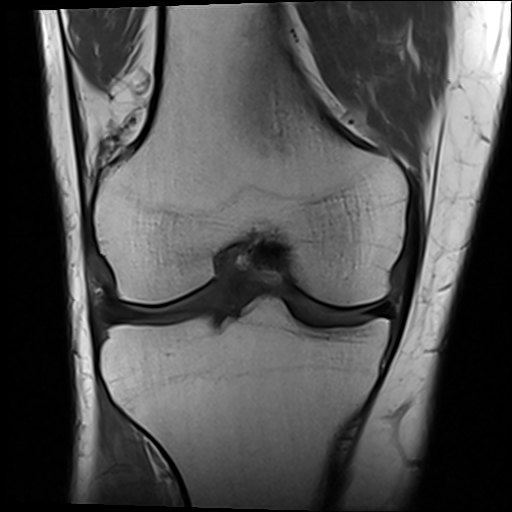
[im 20/25]
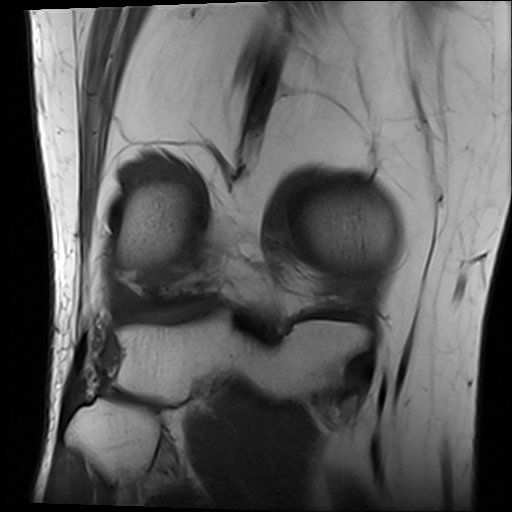

[Series 7: T2 fat-sat · coronal · right · 4.0mm · 0.59mm/px · 7 of 25 slices shown (2 of 3)]
[im 1/25]
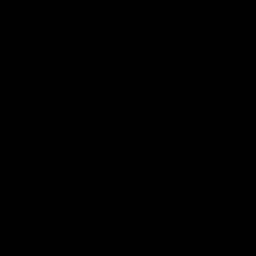
[im 5/25]
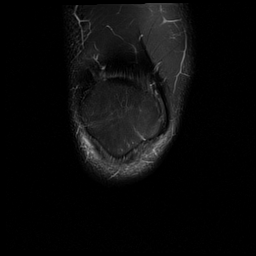
[im 9/25]
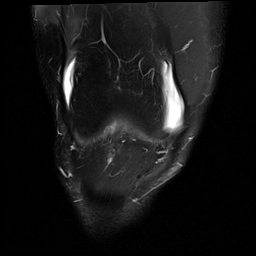
[im 13/25]
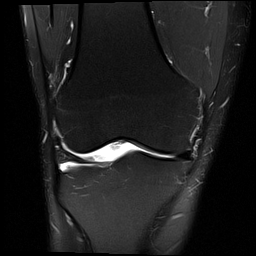
[im 17/25]
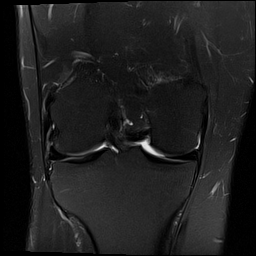
[im 21/25]
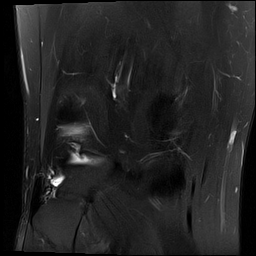
[im 25/25]
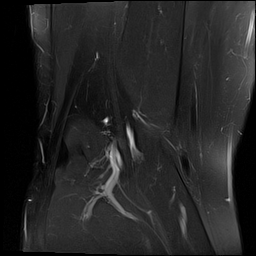

[Series 8: PD fat-sat · coronal · right · 4.0mm · 0.47mm/px · 7 of 25 slices shown (1 of 2)]
[im 1/25]
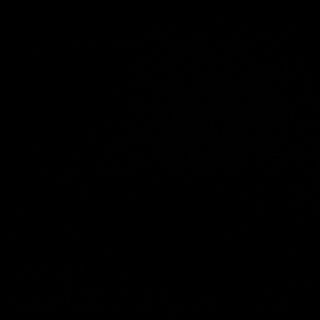
[im 5/25]
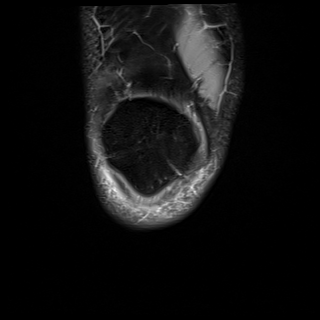
[im 9/25]
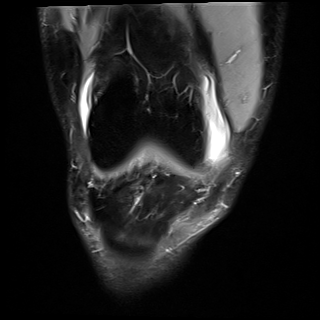
[im 13/25]
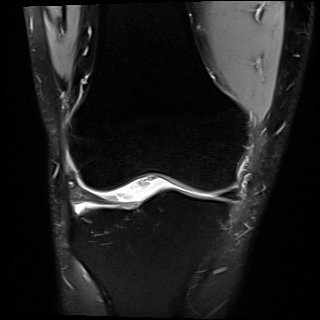
[im 17/25]
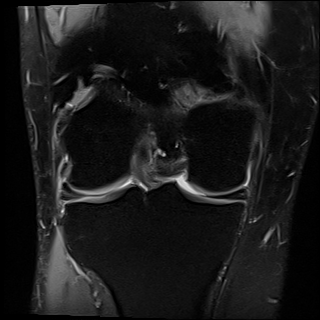
[im 21/25]
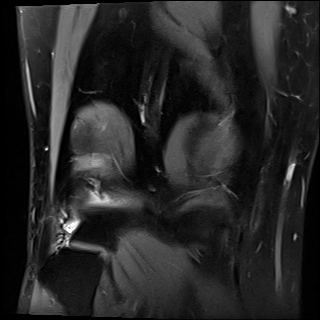
[im 25/25]
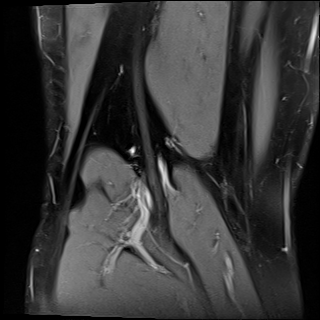

[Series 9: PD fat-sat · sagittal · right · 4.0mm · 0.47mm/px · 6 of 22 slices shown (2 of 2)]
[im 1/22]
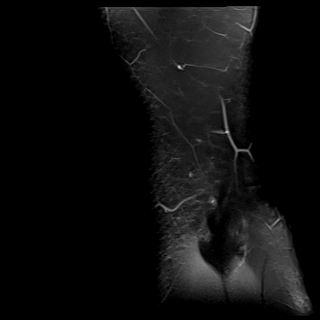
[im 5/22]
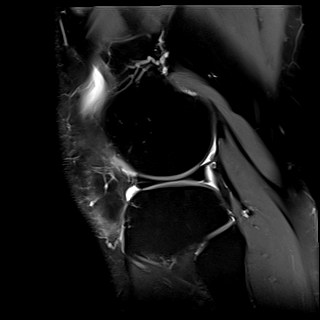
[im 9/22]
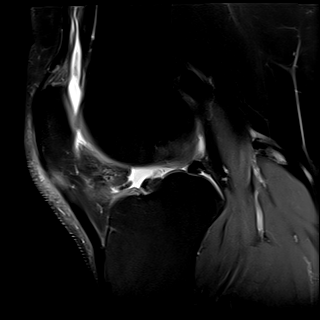
[im 13/22]
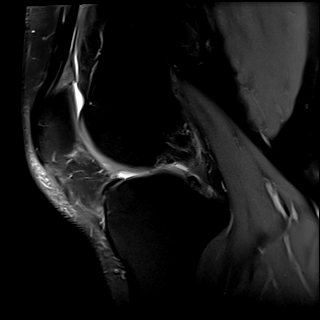
[im 17/22]
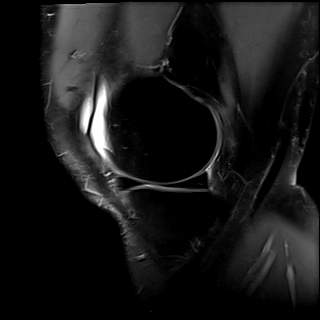
[im 22/22]
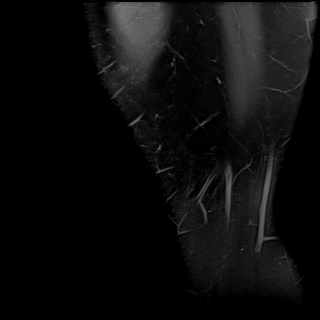

[Series 10: T2 fat-sat · sagittal · right · 4.0mm · 0.47mm/px · 6 of 22 slices shown (3 of 3)]
[im 1/22]
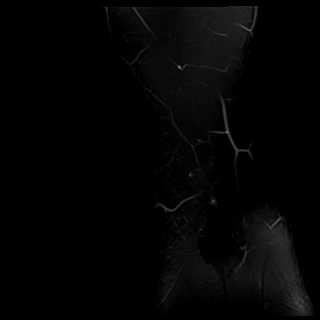
[im 5/22]
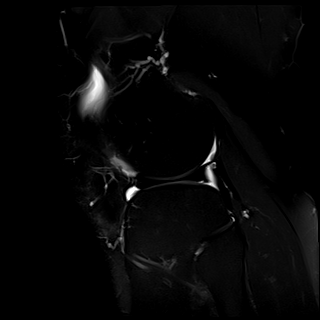
[im 9/22]
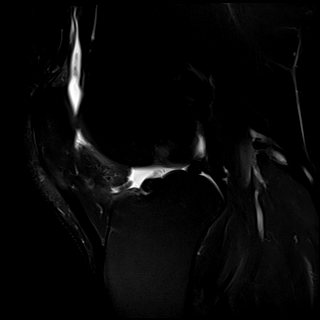
[im 13/22]
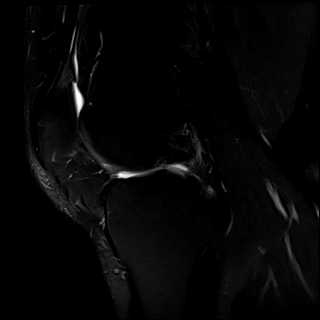
[im 17/22]
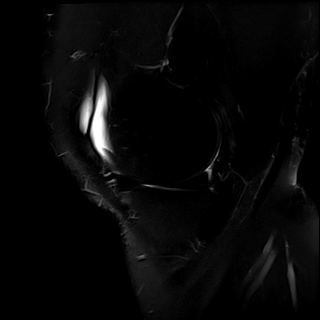
[im 22/22]
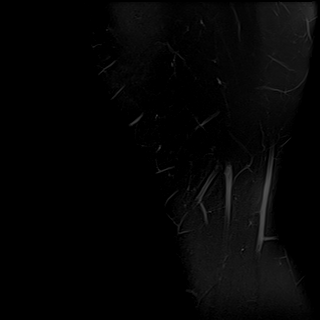

[39 of 40 positions shown; findings below may reference images not displayed]

FINDINGS: MENISCI

Medial meniscus:  Intact.

Lateral meniscus: Horizontal signal within the lateral meniscal body
and anterior horn which closely approximates the inferior articular
surface at the midbody, which may reflect a tear.

LIGAMENTS

Cruciates:  Intact ACL and PCL.

Collaterals: Medial collateral ligament is intact. Lateral
collateral ligament complex is intact.

CARTILAGE

Patellofemoral: Partial-thickness chondral fissuring within the
lateral trochlea and trochlear groove. No patellar cartilage defect.

Medial:  No chondral defect.

Lateral:  No chondral defect.

Joint: Small knee joint effusion. Minimal edema within Hoffa's fat.

Popliteal Fossa:  No Baker cyst. Intact popliteus tendon.

Extensor Mechanism: Proximal patellar tendinosis with tiny
interstitial tear. Quadriceps tendon intact.

Bones: No focal marrow signal abnormality. No fracture or
dislocation.

Other: Unremarkable muscle bulk and signal intensity.
IMPRESSION: 1. Horizontal signal within the lateral meniscal body and anterior
horn which closely approximates the inferior articular surface at
the midbody, which may reflect a tear. Correlate with physical exam.
2. Proximal patellar tendinosis with tiny interstitial tear.
3. Partial thickness chondral irregularity of the trochlea.
4. Small knee joint effusion.

## 2022-08-24 ENCOUNTER — Ambulatory Visit: Payer: Medicaid Other | Admitting: Internal Medicine
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
## 2022-10-13 ENCOUNTER — Ambulatory Visit: Payer: Commercial Managed Care - HMO | Admitting: Physician Assistant
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
## 2022-11-23 ENCOUNTER — Encounter: Payer: Self-pay | Admitting: Physician Assistant

## 2022-11-23 ENCOUNTER — Ambulatory Visit: Payer: Medicaid Other | Admitting: Physician Assistant

## 2022-11-23 ENCOUNTER — Other Ambulatory Visit (INDEPENDENT_AMBULATORY_CARE_PROVIDER_SITE_OTHER): Payer: Medicaid Other

## 2022-11-23 DIAGNOSIS — K625 Hemorrhage of anus and rectum: Secondary | ICD-10-CM

## 2022-11-23 DIAGNOSIS — K219 Gastro-esophageal reflux disease without esophagitis: Secondary | ICD-10-CM

## 2022-11-23 DIAGNOSIS — R112 Nausea with vomiting, unspecified: Secondary | ICD-10-CM | POA: Diagnosis not present

## 2022-11-23 DIAGNOSIS — E1169 Type 2 diabetes mellitus with other specified complication: Secondary | ICD-10-CM

## 2022-11-23 DIAGNOSIS — E669 Obesity, unspecified: Secondary | ICD-10-CM

## 2022-11-23 DIAGNOSIS — Z7984 Long term (current) use of oral hypoglycemic drugs: Secondary | ICD-10-CM

## 2022-11-23 DIAGNOSIS — R197 Diarrhea, unspecified: Secondary | ICD-10-CM | POA: Diagnosis not present

## 2022-11-23 DIAGNOSIS — F250 Schizoaffective disorder, bipolar type: Secondary | ICD-10-CM

## 2022-11-23 LAB — CBC WITH DIFFERENTIAL/PLATELET
Basophils Absolute: 0.1 10*3/uL (ref 0.0–0.1)
Basophils Relative: 0.8 % (ref 0.0–3.0)
Eosinophils Absolute: 0.1 10*3/uL (ref 0.0–0.7)
Eosinophils Relative: 1.2 % (ref 0.0–5.0)
HCT: 40.8 % (ref 39.0–52.0)
Hemoglobin: 13.2 g/dL (ref 13.0–17.0)
Lymphocytes Relative: 15.2 % (ref 12.0–46.0)
Lymphs Abs: 1.6 10*3/uL (ref 0.7–4.0)
MCHC: 32.4 g/dL (ref 30.0–36.0)
MCV: 75.7 fl — ABNORMAL LOW (ref 78.0–100.0)
Monocytes Absolute: 0.6 10*3/uL (ref 0.1–1.0)
Monocytes Relative: 5.4 % (ref 3.0–12.0)
Neutro Abs: 7.9 10*3/uL — ABNORMAL HIGH (ref 1.4–7.7)
Neutrophils Relative %: 77.4 % — ABNORMAL HIGH (ref 43.0–77.0)
Platelets: 299 10*3/uL (ref 150.0–400.0)
RBC: 5.39 Mil/uL (ref 4.22–5.81)
RDW: 17.9 % — ABNORMAL HIGH (ref 11.5–15.5)
WBC: 10.2 10*3/uL (ref 4.0–10.5)

## 2022-11-23 LAB — COMPREHENSIVE METABOLIC PANEL
ALT: 23 U/L (ref 0–53)
AST: 17 U/L (ref 0–37)
Albumin: 4.2 g/dL (ref 3.5–5.2)
Alkaline Phosphatase: 87 U/L (ref 39–117)
BUN: 9 mg/dL (ref 6–23)
CO2: 31 mEq/L (ref 19–32)
Calcium: 9.5 mg/dL (ref 8.4–10.5)
Chloride: 101 mEq/L (ref 96–112)
Creatinine, Ser: 0.93 mg/dL (ref 0.40–1.50)
GFR: 94.76 mL/min (ref >60.00)
Glucose, Bld: 200 mg/dL — ABNORMAL HIGH (ref 70–99)
Potassium: 4.3 mEq/L (ref 3.5–5.1)
Sodium: 138 mEq/L (ref 135–145)
Total Bilirubin: 0.8 mg/dL (ref 0.2–1.2)
Total Protein: 7 g/dL (ref 6.0–8.3)

## 2022-11-23 LAB — IBC + FERRITIN
Ferritin: 36.7 ng/mL (ref 22.0–322.0)
Iron: 56 ug/dL (ref 42–165)
Saturation Ratios: 14.5 % — ABNORMAL LOW (ref 20.0–50.0)
TIBC: 386.4 ug/dL (ref 250.0–450.0)
Transferrin: 276 mg/dL (ref 212.0–360.0)

## 2022-11-23 LAB — CORTISOL: Cortisol, Plasma: 5.6 ug/dL

## 2022-11-23 MED ORDER — ONDANSETRON HCL 8 MG PO TABS: 8.0000 mg | ORAL_TABLET | Freq: Three times a day (TID) | ORAL | 1 refills | Status: DC | PRN

## 2022-11-23 MED ORDER — FAMOTIDINE 40 MG PO TABS
40.0000 mg | ORAL_TABLET | Freq: Every day | ORAL | 3 refills | Status: DC
Start: 2022-11-23 — End: 2022-12-25

## 2022-11-23 MED ORDER — NA SULFATE-K SULFATE-MG SULF 17.5-3.13-1.6 GM/177ML PO SOLN: 1.0000 | ORAL | 0 refills | Status: DC

## 2022-11-23 NOTE — Progress Notes (Signed)
Assessment and plan noted ?

## 2022-11-23 NOTE — Patient Instructions (Addendum)
_______________________________________________________  If your blood pressure at your visit was 140/90 or greater, please contact your primary care physician to follow up on this.  If you are age 52 or younger, your body mass index should be between 19-25. Your Body mass index is 42.95 kg/m. If this is out of the aformentioned range listed, please consider follow up with your Primary Care Provider.  ________________________________________________________  The [place] providers would like to encourage you to use [facility] to communicate with providers for non-urgent requests or questions.  Due to long hold times on the telephone, sending your provider a message by [facility] may be a faster and more efficient way to get a response.  Please allow 48 business hours for a response.  Please remember that this is for non-urgent requests.  _______________________________________________________  Your provider has requested that you go to the basement level for lab work before leaving today. Press "B" on the elevator. The lab is located at the first door on the left as you exit the elevator.  You have been scheduled for a colonoscopy. Please follow written instructions given to you at your visit today.  Please pick up your prep supplies at the pharmacy within the next 1-3 days. If you use inhalers (even only as needed), please bring them with you on the day of your procedure.  We have sent the following medications to your pharmacy for you to pick up at your convenience: famotidine, ondansetron  You have been scheduled for a gastric emptying scan at [facility] on 12-21-22 at 7:30am. Please arrive at least 15 minutes prior to your appointment for registration. Please make certain not to have anything to eat or drink after midnight the night before your test. Hold all stomach medications (ex: Zofran, phenergan, Reglan) 24 hours prior to your test. If you need to reschedule your appointment,  please contact radiology scheduling at [phone]. _____________________________________________________________________ A gastric-emptying study measures how long it takes for food to move through your stomach. There are several ways to measure stomach emptying. In the most common test, you eat food that contains a small amount of radioactive material. A scanner that detects the movement of the radioactive material is placed over your abdomen to monitor the rate at which food leaves your stomach. This test normally takes about 4 hours to complete. _____________________________________________________________________  Due to recent changes in healthcare laws, you may see the results of your imaging and laboratory studies on MyChart before your provider has had a chance to review them.  We understand that in some cases there may be results that are confusing or concerning to you. Not all laboratory results come back in the same time frame and the provider may be waiting for multiple results in order to interpret others.  Please give [place] 48 hours in order for your provider to thoroughly review all the results before contacting the office for clarification of your results.   Can take zofran as needed and take before prep   Gastroparesis Please do small frequent meals like 4-6 meals a day.  Eat and drink liquids at separate times.  Avoid high fiber foods, cook your vegetables, avoid high fat food.  Suggest spreading protein throughout the day (greek yogurt, glucerna, soft meat, milk, eggs) Choose soft foods that you can mash with a fork When you are more symptomatic, change to pureed foods foods and liquids.  Consider reading "Living well with Gastroparesis" by Reuel Derby Gastroparesis is a condition in which food takes longer than  normal to empty from the stomach. This condition is also known as delayed gastric emptying. It is usually a long-term (chronic) condition. There is no cure, but  there are treatments and things that you can do at home to help relieve symptoms. Treating the underlying condition that causes gastroparesis can also help relieve symptoms What are the causes? In many cases, the cause of this condition is not known. Possible causes include: A hormone (endocrine) disorder, such as hypothyroidism or diabetes. A nervous system disease, such as Parkinson's disease or multiple sclerosis. Cancer, infection, or surgery that affects the stomach or vagus nerve. The vagus nerve runs from your chest, through your neck, and to the lower part of your brain. A connective tissue disorder, such as scleroderma. Certain medicines. What increases the risk? You are more likely to develop this condition if: You have certain disorders or diseases. These may include: An endocrine disorder. An eating disorder. Amyloidosis. Scleroderma. Parkinson's disease. Multiple sclerosis. Cancer or infection of the stomach or the vagus nerve. You have had surgery on your stomach or vagus nerve. You take certain medicines. You are male. What are the signs or symptoms? Symptoms of this condition include: Feeling full after eating very little or a loss of appetite. Nausea, vomiting, or heartburn. Bloating of your abdomen. Inconsistent blood sugar (glucose) levels on blood tests. Unexplained weight loss. Acid from the stomach coming up into the esophagus (gastroesophageal reflux). Sudden tightening (spasm) of the stomach, which can be painful. Symptoms may come and go. Some people may not notice any symptoms. How is this diagnosed? This condition is diagnosed with tests, such as: Tests that check how long it takes food to move through the stomach and intestines. These tests include: Upper gastrointestinal (GI) series. For this test, you drink a liquid that shows up well on X-rays, and then X-rays are taken of your intestines. Gastric emptying scintigraphy. For this test, you eat food  that contains a small amount of radioactive material, and then scans are taken. Wireless capsule GI monitoring system. For this test, you swallow a pill (capsule) that records information about how foods and fluid move through your stomach. Gastric manometry. For this test, a tube is passed down your throat and into your stomach to measure electrical and muscular activity. Endoscopy. For this test, a long, thin tube with a camera and light on the end is passed down your throat and into your stomach to check for problems in your stomach lining. Ultrasound. This test uses sound waves to create images of the inside of your body. This can help rule out gallbladder disease or pancreatitis as a cause of your symptoms. How is this treated? There is no cure for this condition, but treatment and home care may relieve symptoms. Treatment may include: Treating the underlying cause. Managing your symptoms by making changes to your diet and exercise habits. Taking medicines to control nausea and vomiting and to stimulate stomach muscles. Getting food through a feeding tube in the hospital. This may be done in severe cases. Having surgery to insert a device called a gastric electrical stimulator into your body. This device helps improve stomach emptying and control nausea and vomiting. Follow these instructions at home: Take over-the-counter and prescription medicines only as told by your health care provider. Follow instructions from your health care provider about eating or drinking restrictions. Your health care provider may recommend that you: Eat smaller meals more often. Eat low-fat foods. Eat low-fiber forms of high-fiber foods. For example, eat  cooked vegetables instead of raw vegetables. Have only liquid foods instead of solid foods. Liquid foods are easier to digest. Drink enough fluid to keep your urine pale yellow. Exercise as often as told by your health care provider. Keep all follow-up visits.  This is important. Contact a health care provider if you: Notice that your symptoms do not improve with treatment. Have new symptoms. Get help right away if you: Have severe pain in your abdomen that does not improve with treatment. Have nausea that is severe or does not go away. Vomit every time you drink fluids. Summary Gastroparesis is a long-term (chronic) condition in which food takes longer than normal to empty from the stomach. Symptoms include nausea, vomiting, heartburn, bloating of your abdomen, and loss of appetite. Eating smaller portions, low-fat foods, and low-fiber forms of high-fiber foods may help you manage your symptoms. Get help right away if you have severe pain in your abdomen. This information is not intended to replace advice given to you by your health care provider. Make sure you discuss any questions you have with your health care provider. Document Revised: 12/03/2019 Document Reviewed: 12/03/2019 Elsevier Patient Education  2021 [employer] Inc.  Thank you for entrusting me with your care and choosing [facility].  Quentin Mulling, PA-C

## 2022-11-25 LAB — TISSUE TRANSGLUTAMINASE, IGA: (tTG) Ab, IgA: <1 U/mL

## 2022-11-25 LAB — IGA: Immunoglobulin A: 160 mg/dL (ref 47–310)

## 2022-11-26 ENCOUNTER — Telehealth: Payer: Self-pay | Admitting: Physician Assistant

## 2022-11-26 NOTE — Telephone Encounter (Signed)
PT has a colonoscopy scheduled for 4/23 and as of yesterday he had eaten corn and taken an iron supplement. Will he be okay to still have procedure done. Please advise.

## 2022-11-29 NOTE — Telephone Encounter (Signed)
See note below and advise. 

## 2022-11-29 NOTE — Telephone Encounter (Signed)
Continue with colonoscopy as planned 

## 2022-11-29 NOTE — Telephone Encounter (Signed)
Spoke with pt and he is aware of recommendations and will proceed as scheduled.

## 2022-11-30 ENCOUNTER — Encounter: Payer: Self-pay | Admitting: Internal Medicine

## 2022-11-30 ENCOUNTER — Ambulatory Visit (HOSPITAL_COMMUNITY): Payer: Self-pay | Admitting: Psychiatry

## 2022-11-30 ENCOUNTER — Ambulatory Visit (AMBULATORY_SURGERY_CENTER): Payer: Medicaid Other | Admitting: Internal Medicine

## 2022-11-30 VITALS — BP 120/77 | HR 82 | Resp 19

## 2022-11-30 DIAGNOSIS — D122 Benign neoplasm of ascending colon: Secondary | ICD-10-CM

## 2022-11-30 DIAGNOSIS — R197 Diarrhea, unspecified: Secondary | ICD-10-CM

## 2022-11-30 DIAGNOSIS — K625 Hemorrhage of anus and rectum: Secondary | ICD-10-CM

## 2022-11-30 DIAGNOSIS — D124 Benign neoplasm of descending colon: Secondary | ICD-10-CM

## 2022-11-30 DIAGNOSIS — D123 Benign neoplasm of transverse colon: Secondary | ICD-10-CM | POA: Diagnosis not present

## 2022-11-30 DIAGNOSIS — Z1211 Encounter for screening for malignant neoplasm of colon: Secondary | ICD-10-CM | POA: Diagnosis not present

## 2022-11-30 MED ORDER — SODIUM CHLORIDE 0.9 % IV SOLN
500.0000 mL | INTRAVENOUS | Status: DC
Start: 2022-11-30 — End: 2022-11-30

## 2022-11-30 NOTE — Patient Instructions (Signed)
YOU HAD AN ENDOSCOPIC PROCEDURE TODAY AT THE [facility]:   Refer to the procedure report that was given to you for any specific questions about what was found during the examination.  If the procedure report does not answer your questions, please call your gastroenterologist to clarify.  If you requested that your care partner not be given the details of your procedure findings, then the procedure report has been included in a sealed envelope for you to review at your convenience later.  **Handout given on polyps**  YOU SHOULD EXPECT: Some feelings of bloating in the abdomen. Passage of more gas than usual.  Walking can help get rid of the air that was put into your GI tract during the procedure and reduce the bloating. If you had a lower endoscopy (such as a colonoscopy or flexible sigmoidoscopy) you may notice spotting of blood in your stool or on the toilet paper. If you underwent a bowel prep for your procedure, you may not have a normal bowel movement for a few days.  Please Note:  You might notice some irritation and congestion in your nose or some drainage.  This is from the oxygen used during your procedure.  There is no need for concern and it should clear up in a day or so.  SYMPTOMS TO REPORT IMMEDIATELY:  Following lower endoscopy (colonoscopy or flexible sigmoidoscopy):  Excessive amounts of blood in the stool  Significant tenderness or worsening of abdominal pains  Swelling of the abdomen that is new, acute  Fever of 100F or higher  For urgent or emergent issues, a gastroenterologist can be reached at any hour by calling [phone]. Do not use MyChart messaging for urgent concerns.    DIET:  We do recommend a small meal at first, but then you may proceed to your regular diet.  Drink plenty of fluids but you should avoid alcoholic beverages for 24 hours.  ACTIVITY:  You should plan to take it easy for the rest of today and you should NOT DRIVE or use heavy  machinery until tomorrow (because of the sedation medicines used during the test).    FOLLOW UP: Our staff will call the number listed on your records the next business day following your procedure.  We will call around 7:15- 8:00 am to check on you and address any questions or concerns that you may have regarding the information given to you following your procedure. If we do not reach you, we will leave a message.     If any biopsies were taken you will be contacted by phone or by letter within the next 1-3 weeks.  Please call us at [phone] if you have not heard about the biopsies in 3 weeks.    SIGNATURES/CONFIDENTIALITY: You and/or your care partner have signed paperwork which will be entered into your electronic medical record.  These signatures attest to the fact that that the information above on your After Visit Summary has been reviewed and is understood.  Full responsibility of the confidentiality of this discharge information lies with you and/or your care-partner.  
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
## 2022-11-30 NOTE — Progress Notes (Signed)
Vss nad trans to pacu 

## 2022-11-30 NOTE — Op Note (Signed)
[facility] Patient Name: Leon Ross Procedure Date: 11/30/2022 11:12 AM MRN: [id number] Endoscopist: Wilhemina Bonito. Marina Goodell , MD, [id number] Age: 52 Referring MD:  Date of Birth: 25-May-1971 Gender: Male Account #: [id number] Procedure:                Colonoscopy with cold snare polypectomy x 4;                            biopsies Indications:              Screening for colorectal malignant neoplasm;                            incidental complaints of alternating bowel habits                            and minor rectal bleeding Medicines:                Monitored Anesthesia Care Procedure:                Pre-Anesthesia Assessment:                           - Prior to the procedure, a History and Physical                            was performed, and patient medications and                            allergies were reviewed. The patient's tolerance of                            previous anesthesia was also reviewed. The risks                            and benefits of the procedure and the sedation                            options and risks were discussed with the patient.                            All questions were answered, and informed consent                            was obtained. Prior Anticoagulants: The patient has                            taken no anticoagulant or antiplatelet agents. ASA                            Grade Assessment: II - A patient with mild systemic                            disease. After reviewing the risks and benefits,  the patient was deemed in satisfactory condition to                            undergo the procedure.                           After obtaining informed consent, the colonoscope                            was passed under direct vision. Throughout the                            procedure, the patient's blood pressure, pulse, and                            oxygen saturations were monitored  continuously. The                            Olympus CF-HQ190L ([id number]) Colonoscope was                            introduced through the anus and advanced to the the                            cecum, identified by appendiceal orifice and                            ileocecal valve. The ileocecal valve, appendiceal                            orifice, and rectum were photographed. The quality                            of the bowel preparation was excellent. The                            colonoscopy was performed without difficulty. The                            patient tolerated the procedure well. The bowel                            preparation used was SUPREP via split dose                            instruction. Scope In: 11:25:40 AM Scope Out: 11:39:39 AM Scope Withdrawal Time: 0 hours 11 minutes 10 seconds  Total Procedure Duration: 0 hours 13 minutes 59 seconds  Findings:                 Four polyps were found in the descending colon,                            transverse colon and ascending colon. The polyps  were 2 to 6 mm in size. These polyps were removed                            with a cold snare. Resection and retrieval were                            complete.                           The exam was otherwise without abnormality on                            direct and retroflexion views.                           Biopsies for histology were taken with a cold                            forceps from the entire colon for evaluation of                            microscopic colitis. Complications:            No immediate complications. Estimated blood loss:                            None. Estimated Blood Loss:     Estimated blood loss: none. Impression:               - Four 2 to 6 mm polyps in the descending colon, in                            the transverse colon and in the ascending colon,                            removed with a cold snare.  Resected and retrieved.                           - The examination was otherwise normal on direct                            and retroflexion views.                           - Biopsies were taken with a cold forceps from the                            entire colon for evaluation of microscopic colitis. Recommendation:           - Repeat colonoscopy in 3 years for surveillance.                           - Patient has a contact number available for  emergencies. The signs and symptoms of potential                            delayed complications were discussed with the                            patient. Return to normal activities tomorrow.                            Written discharge instructions were provided to the                            patient.                           - Resume previous diet.                           - Continue present medications.                           - Await pathology results.                           - Recommend fortified fiber called Citrucel 2                            tablespoons daily and 12 to 14 ounces of water or                            juice. This will help regulate your bowel movements Zebastian Carico N. Marina Goodell, MD 11/30/2022 11:56:05 AM This report has been signed electronically.

## 2022-12-01 ENCOUNTER — Telehealth: Payer: Self-pay

## 2022-12-01 NOTE — Telephone Encounter (Signed)
  Follow up Call-     11/30/2022   10:45 AM 11/24/2021    8:36 AM  Call back number  Post procedure Call Back phone  # [phone]  Permission to leave phone message Yes Yes     Patient questions:  Do you have a fever, pain , or abdominal swelling? No. Pain Score  0 *  Have you tolerated food without any problems? Yes.    Have you been able to return to your normal activities? Yes.    Do you have any questions about your discharge instructions: Diet   No. Medications  No. Follow up visit  No.  Do you have questions or concerns about your Care? No.  Actions: * If pain score is 4 or above: No action needed, pain <4.

## 2022-12-03 ENCOUNTER — Encounter: Payer: Self-pay | Admitting: Internal Medicine

## 2022-12-13 ENCOUNTER — Ambulatory Visit (INDEPENDENT_AMBULATORY_CARE_PROVIDER_SITE_OTHER): Payer: Medicaid Other | Admitting: Orthopedic Surgery

## 2022-12-13 ENCOUNTER — Other Ambulatory Visit (INDEPENDENT_AMBULATORY_CARE_PROVIDER_SITE_OTHER): Payer: Medicaid Other

## 2022-12-13 ENCOUNTER — Encounter: Payer: Self-pay | Admitting: Orthopedic Surgery

## 2022-12-13 DIAGNOSIS — M25561 Pain in right knee: Secondary | ICD-10-CM | POA: Diagnosis not present

## 2022-12-13 DIAGNOSIS — M23200 Derangement of unspecified lateral meniscus due to old tear or injury, right knee: Secondary | ICD-10-CM | POA: Diagnosis not present

## 2022-12-13 DIAGNOSIS — G8929 Other chronic pain: Secondary | ICD-10-CM | POA: Diagnosis not present
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
## 2022-12-21 ENCOUNTER — Encounter (HOSPITAL_COMMUNITY)
Admission: RE | Admit: 2022-12-21 | Discharge: 2022-12-21 | Disposition: A | Discharge status: Home / Self Care | Payer: Medicaid Other | Source: Ambulatory Visit | Attending: Physician Assistant | Admitting: Physician Assistant

## 2022-12-21 DIAGNOSIS — K219 Gastro-esophageal reflux disease without esophagitis: Secondary | ICD-10-CM | POA: Insufficient documentation

## 2022-12-21 DIAGNOSIS — R112 Nausea with vomiting, unspecified: Secondary | ICD-10-CM | POA: Insufficient documentation

## 2022-12-21 MED ORDER — TECHNETIUM TC 99M SULFUR COLLOID
2.1000 | Freq: Once | INTRAVENOUS | Status: AC
  Administered 2022-12-21: 2.1 via INTRAVENOUS

## 2022-12-22 ENCOUNTER — Inpatient Hospital Stay (HOSPITAL_COMMUNITY)
Admission: EM | Admit: 2022-12-22 | Discharge: 2022-12-25 | DRG: 184 | Disposition: A | Discharge status: Home / Self Care | Payer: Medicaid Other | Attending: Surgery | Admitting: Surgery

## 2022-12-22 ENCOUNTER — Emergency Department (HOSPITAL_COMMUNITY): Payer: Medicaid Other

## 2022-12-22 DIAGNOSIS — S2241XA Multiple fractures of ribs, right side, initial encounter for closed fracture: Principal | ICD-10-CM | POA: Diagnosis present

## 2022-12-22 DIAGNOSIS — Z7984 Long term (current) use of oral hypoglycemic drugs: Secondary | ICD-10-CM

## 2022-12-22 DIAGNOSIS — Z1152 Encounter for screening for COVID-19: Secondary | ICD-10-CM

## 2022-12-22 DIAGNOSIS — Z6841 Body Mass Index (BMI) 40.0 and over, adult: Secondary | ICD-10-CM

## 2022-12-22 DIAGNOSIS — Z79899 Other long term (current) drug therapy: Secondary | ICD-10-CM

## 2022-12-22 DIAGNOSIS — Z9049 Acquired absence of other specified parts of digestive tract: Secondary | ICD-10-CM

## 2022-12-22 DIAGNOSIS — S0093XA Contusion of unspecified part of head, initial encounter: Secondary | ICD-10-CM | POA: Diagnosis present

## 2022-12-22 DIAGNOSIS — R059 Cough, unspecified: Secondary | ICD-10-CM | POA: Diagnosis present

## 2022-12-22 DIAGNOSIS — S80211A Abrasion, right knee, initial encounter: Secondary | ICD-10-CM | POA: Diagnosis present

## 2022-12-22 DIAGNOSIS — S32029A Unspecified fracture of second lumbar vertebra, initial encounter for closed fracture: Secondary | ICD-10-CM | POA: Diagnosis present

## 2022-12-22 DIAGNOSIS — Z87891 Personal history of nicotine dependence: Secondary | ICD-10-CM

## 2022-12-22 DIAGNOSIS — Y9389 Activity, other specified: Secondary | ICD-10-CM

## 2022-12-22 DIAGNOSIS — S2249XA Multiple fractures of ribs, unspecified side, initial encounter for closed fracture: Secondary | ICD-10-CM | POA: Diagnosis present

## 2022-12-22 DIAGNOSIS — S32039A Unspecified fracture of third lumbar vertebra, initial encounter for closed fracture: Secondary | ICD-10-CM | POA: Diagnosis present

## 2022-12-22 DIAGNOSIS — S32049A Unspecified fracture of fourth lumbar vertebra, initial encounter for closed fracture: Secondary | ICD-10-CM | POA: Diagnosis present

## 2022-12-22 DIAGNOSIS — W132XXA Fall from, out of or through roof, initial encounter: Principal | ICD-10-CM | POA: Diagnosis present

## 2022-12-22 DIAGNOSIS — K3184 Gastroparesis: Secondary | ICD-10-CM | POA: Diagnosis present

## 2022-12-22 DIAGNOSIS — S37812A Contusion of adrenal gland, initial encounter: Secondary | ICD-10-CM | POA: Diagnosis present

## 2022-12-22 DIAGNOSIS — S32009A Unspecified fracture of unspecified lumbar vertebra, initial encounter for closed fracture: Secondary | ICD-10-CM

## 2022-12-22 DIAGNOSIS — Y92018 Other place in single-family (private) house as the place of occurrence of the external cause: Secondary | ICD-10-CM

## 2022-12-22 DIAGNOSIS — Z794 Long term (current) use of insulin: Secondary | ICD-10-CM

## 2022-12-22 DIAGNOSIS — Z801 Family history of malignant neoplasm of trachea, bronchus and lung: Secondary | ICD-10-CM

## 2022-12-22 DIAGNOSIS — S20221A Contusion of right back wall of thorax, initial encounter: Secondary | ICD-10-CM

## 2022-12-22 DIAGNOSIS — R509 Fever, unspecified: Secondary | ICD-10-CM | POA: Diagnosis present

## 2022-12-22 DIAGNOSIS — E669 Obesity, unspecified: Secondary | ICD-10-CM | POA: Diagnosis present

## 2022-12-22 DIAGNOSIS — T07XXXA Unspecified multiple injuries, initial encounter: Secondary | ICD-10-CM

## 2022-12-22 DIAGNOSIS — I10 Essential (primary) hypertension: Secondary | ICD-10-CM | POA: Diagnosis present

## 2022-12-22 DIAGNOSIS — Z888 Allergy status to other drugs, medicaments and biological substances status: Secondary | ICD-10-CM

## 2022-12-22 DIAGNOSIS — S32059A Unspecified fracture of fifth lumbar vertebra, initial encounter for closed fracture: Secondary | ICD-10-CM | POA: Diagnosis present

## 2022-12-22 DIAGNOSIS — S32019A Unspecified fracture of first lumbar vertebra, initial encounter for closed fracture: Secondary | ICD-10-CM | POA: Diagnosis present

## 2022-12-22 DIAGNOSIS — F32A Depression, unspecified: Secondary | ICD-10-CM | POA: Diagnosis present

## 2022-12-22 DIAGNOSIS — S20211A Contusion of right front wall of thorax, initial encounter: Secondary | ICD-10-CM | POA: Diagnosis present

## 2022-12-22 DIAGNOSIS — Z23 Encounter for immunization: Secondary | ICD-10-CM

## 2022-12-22 DIAGNOSIS — G4733 Obstructive sleep apnea (adult) (pediatric): Secondary | ICD-10-CM | POA: Diagnosis present

## 2022-12-22 DIAGNOSIS — Z803 Family history of malignant neoplasm of breast: Secondary | ICD-10-CM

## 2022-12-22 DIAGNOSIS — S80212A Abrasion, left knee, initial encounter: Secondary | ICD-10-CM | POA: Diagnosis present

## 2022-12-22 DIAGNOSIS — Z560 Unemployment, unspecified: Secondary | ICD-10-CM

## 2022-12-22 DIAGNOSIS — E1143 Type 2 diabetes mellitus with diabetic autonomic (poly)neuropathy: Secondary | ICD-10-CM | POA: Diagnosis present

## 2022-12-22 DIAGNOSIS — F259 Schizoaffective disorder, unspecified: Secondary | ICD-10-CM | POA: Diagnosis present

## 2022-12-22 DIAGNOSIS — K219 Gastro-esophageal reflux disease without esophagitis: Secondary | ICD-10-CM | POA: Diagnosis present

## 2022-12-22 DIAGNOSIS — E785 Hyperlipidemia, unspecified: Secondary | ICD-10-CM | POA: Diagnosis present

## 2022-12-22 HISTORY — DX: Multiple fractures of ribs, right side, initial encounter for closed fracture: S22.41XA

## 2022-12-22 LAB — CBC
HCT: 46.7 % (ref 39.0–52.0)
Hemoglobin: 14.5 g/dL (ref 13.0–17.0)
MCH: 24.1 pg — ABNORMAL LOW (ref 26.0–34.0)
MCHC: 31 g/dL (ref 30.0–36.0)
MCV: 77.6 fL — ABNORMAL LOW (ref 80.0–100.0)
Platelets: 308 10*3/uL (ref 150–400)
RBC: 6.02 MIL/uL — ABNORMAL HIGH (ref 4.22–5.81)
RDW: 16.2 % — ABNORMAL HIGH (ref 11.5–15.5)
WBC: 11.4 10*3/uL — ABNORMAL HIGH (ref 4.0–10.5)
nRBC: 0 % (ref 0.0–0.2)

## 2022-12-22 LAB — COMPREHENSIVE METABOLIC PANEL
ALT: 199 U/L — ABNORMAL HIGH (ref 0–44)
AST: 253 U/L — ABNORMAL HIGH (ref 15–41)
Albumin: 4.1 g/dL (ref 3.5–5.0)
Alkaline Phosphatase: 94 U/L (ref 38–126)
Anion gap: 10 (ref 5–15)
BUN: 11 mg/dL (ref 6–20)
CO2: 29 mmol/L (ref 22–32)
Calcium: 10 mg/dL (ref 8.9–10.3)
Chloride: 99 mmol/L (ref 98–111)
Creatinine, Ser: 1.36 mg/dL — ABNORMAL HIGH (ref 0.61–1.24)
GFR, Estimated: >60 mL/min (ref >60)
Glucose, Bld: 204 mg/dL — ABNORMAL HIGH (ref 70–99)
Potassium: 4.4 mmol/L (ref 3.5–5.1)
Sodium: 138 mmol/L (ref 135–145)
Total Bilirubin: 0.8 mg/dL (ref 0.3–1.2)
Total Protein: 7.6 g/dL (ref 6.5–8.1)

## 2022-12-22 LAB — I-STAT CHEM 8, ED
BUN: 11 mg/dL (ref 6–20)
Calcium, Ion: 1.24 mmol/L (ref 1.15–1.40)
Chloride: 99 mmol/L (ref 98–111)
Creatinine, Ser: 1.3 mg/dL — ABNORMAL HIGH (ref 0.61–1.24)
Glucose, Bld: 204 mg/dL — ABNORMAL HIGH (ref 70–99)
HCT: 45 % (ref 39.0–52.0)
Hemoglobin: 15.3 g/dL (ref 13.0–17.0)
Potassium: 4.4 mmol/L (ref 3.5–5.1)
Sodium: 139 mmol/L (ref 135–145)
TCO2: 30 mmol/L (ref 22–32)

## 2022-12-22 MED ORDER — HYDRALAZINE HCL 20 MG/ML IJ SOLN: 10.0000 mg | INTRAMUSCULAR | Status: DC | PRN

## 2022-12-22 MED ORDER — LACTATED RINGERS IV BOLUS
1000.0000 mL | Freq: Once | INTRAVENOUS | Status: AC
  Administered 2022-12-22: 1000 mL via INTRAVENOUS

## 2022-12-22 MED ORDER — ACETAMINOPHEN 500 MG PO TABS
1000.0000 mg | ORAL_TABLET | Freq: Four times a day (QID) | ORAL | Status: DC
  Administered 2022-12-22 – 2022-12-25 (×11): 1000 mg via ORAL
  Filled 2022-12-22 (×11): qty 2

## 2022-12-22 MED ORDER — ONDANSETRON 4 MG PO TBDP: 4.0000 mg | ORAL_TABLET | Freq: Four times a day (QID) | ORAL | Status: DC | PRN

## 2022-12-22 MED ORDER — DOCUSATE SODIUM 100 MG PO CAPS
100.0000 mg | ORAL_CAPSULE | Freq: Two times a day (BID) | ORAL | Status: DC
  Administered 2022-12-22 – 2022-12-25 (×6): 100 mg via ORAL
  Filled 2022-12-22 (×6): qty 1

## 2022-12-22 MED ORDER — MORPHINE SULFATE (PF) 4 MG/ML IV SOLN
4.0000 mg | INTRAVENOUS | Status: DC | PRN
  Administered 2022-12-25: 4 mg via INTRAVENOUS
  Filled 2022-12-22: qty 1

## 2022-12-22 MED ORDER — METOPROLOL TARTRATE 5 MG/5ML IV SOLN: 5.0000 mg | Freq: Four times a day (QID) | INTRAVENOUS | Status: DC | PRN

## 2022-12-22 MED ORDER — METHOCARBAMOL 500 MG PO TABS
1000.0000 mg | ORAL_TABLET | Freq: Three times a day (TID) | ORAL | Status: DC
  Administered 2022-12-22 – 2022-12-25 (×8): 1000 mg via ORAL
  Filled 2022-12-22 (×8): qty 2

## 2022-12-22 MED ORDER — TETANUS-DIPHTH-ACELL PERTUSSIS 5-2.5-18.5 LF-MCG/0.5 IM SUSY
0.5000 mL | PREFILLED_SYRINGE | Freq: Once | INTRAMUSCULAR | Status: AC
  Administered 2022-12-22: 0.5 mL via INTRAMUSCULAR
  Filled 2022-12-22: qty 0.5

## 2022-12-22 MED ORDER — OXYCODONE HCL 5 MG PO TABS
5.0000 mg | ORAL_TABLET | ORAL | Status: DC | PRN
  Administered 2022-12-23 – 2022-12-25 (×4): 10 mg via ORAL
  Filled 2022-12-22 (×4): qty 2

## 2022-12-22 MED ORDER — HYDROMORPHONE HCL 1 MG/ML IJ SOLN
1.0000 mg | Freq: Once | INTRAMUSCULAR | Status: AC
  Administered 2022-12-22: 1 mg via INTRAVENOUS
  Filled 2022-12-22: qty 1

## 2022-12-22 MED ORDER — ENOXAPARIN SODIUM 30 MG/0.3ML IJ SOSY
30.0000 mg | PREFILLED_SYRINGE | Freq: Two times a day (BID) | INTRAMUSCULAR | Status: DC
  Administered 2022-12-23 – 2022-12-25 (×5): 30 mg via SUBCUTANEOUS
  Filled 2022-12-22 (×5): qty 0.3

## 2022-12-22 MED ORDER — ONDANSETRON HCL 4 MG/2ML IJ SOLN
4.0000 mg | Freq: Once | INTRAMUSCULAR | Status: AC
  Administered 2022-12-22: 4 mg via INTRAVENOUS
  Filled 2022-12-22: qty 2

## 2022-12-22 MED ORDER — ONDANSETRON HCL 4 MG/2ML IJ SOLN: 4.0000 mg | Freq: Four times a day (QID) | INTRAMUSCULAR | Status: DC | PRN

## 2022-12-22 MED ORDER — IOHEXOL 350 MG/ML SOLN
75.0000 mL | Freq: Once | INTRAVENOUS | Status: AC | PRN
  Administered 2022-12-22: 75 mL via INTRAVENOUS

## 2022-12-22 MED ORDER — POLYETHYLENE GLYCOL 3350 17 G PO PACK: 17.0000 g | PACK | Freq: Every day | ORAL | Status: DC | PRN

## 2022-12-22 NOTE — ED Provider Notes (Signed)
[place] [facility] AT [facility] Provider Note   CSN: [id number] Arrival date & time: 12/22/22  1738     History  Chief Complaint  Patient presents with   Leon Ross is a 52 y.o. male.  Pt s/p fall from roof of mobile home. Pt had climbed up there to place a tarp on the roof, stumbled/lost balance, and fell to concrete. No loc. Contusion to head and upper back/lateral chest, and abrasions to bilateral knees. Was not ambulatory post fall. EMS transported. No anticoagulant use. Felt fine, asymptomatic prior to mechanical fall - no faintness or dizziness. No midline neck/back or radicular pain. No numbness/weakness. No chest pain or sob. No abd pain or nv. No focal extremity pain other than abrasions to anterior lower legs. Last tetanus not known.   The history is provided by the patient, medical records and the EMS personnel.  Fall Pertinent negatives include no chest pain, no abdominal pain and no shortness of breath.       Home Medications Prior to Admission medications   Medication Sig Start Date End Date Taking? Authorizing Provider  atorvastatin (LIPITOR) 40 MG tablet Take 40 mg by mouth at bedtime. 10/14/22   [provider]  dapagliflozin propanediol (FARXIGA) 10 MG TABS tablet Take 10 mg by mouth daily.    [provider]  famotidine (PEPCID) 40 MG tablet Take 1 tablet (40 mg total) by mouth at bedtime. 11/23/22   Doree Albee, PA-C  GABAPENTIN PO Take 1 tablet by mouth 2 (two) times daily.    [provider]  glyBURIDE (DIABETA) 5 MG tablet Take 5 mg by mouth 2 (two) times daily. 04/03/21   [provider]  hydrOXYzine (ATARAX/VISTARIL) 25 MG tablet Take 1 tablet (25 mg total) by mouth at bedtime. 05/29/21   Lamar Sprinkles, MD  Insulin Glargine (BASAGLAR KWIKPEN) 100 UNIT/ML 20 Units 3 (three) times daily. 08/20/22   [provider]  insulin lispro (HUMALOG) 100 UNIT/ML cartridge (Discard  after 28 days) Take 20 units 3 times daily with meals subcutaneously. Take with sliding scale insulin. 08/20/22   [provider]  lamoTRIgine (LAMICTAL) 150 MG tablet Take 150 mg by mouth daily. 03/16/21   [provider]  lisinopril (ZESTRIL) 10 MG tablet Take 10 mg by mouth daily. 09/01/21   [provider]  metFORMIN (GLUCOPHAGE-XR) 500 MG 24 hr tablet Take 500 mg by mouth 2 (two) times daily. 04/03/21   [provider]  mirtazapine (REMERON) 15 MG tablet Take 15 mg by mouth at bedtime. 03/16/21   [provider]  Na Sulfate-K Sulfate-Mg Sulf (SUPREP BOWEL PREP KIT) 17.5-3.13-1.6 GM/177ML SOLN Take 1 kit by mouth as directed. 11/23/22   Doree Albee, PA-C  OLANZapine (ZYPREXA) 20 MG tablet Take 1 tablet (20 mg total) by mouth at bedtime. 05/29/21   Lamar Sprinkles, MD  omeprazole (PRILOSEC) 40 MG capsule Take 40 mg by mouth daily as needed (for acid reflux). 04/28/21   [provider]  ondansetron (ZOFRAN) 8 MG tablet Take 1 tablet (8 mg total) by mouth every 8 (eight) hours as needed for nausea or vomiting. 11/23/22   Doree Albee, PA-C  paliperidone (INVEGA) 9 MG 24 hr tablet Take 9 mg by mouth every morning. 10/18/22   [provider]  pantoprazole (PROTONIX) 40 MG tablet Take 1 tablet (40 mg total) by mouth daily. 11/24/21   Hilarie Fredrickson, MD  prazosin (MINIPRESS) 2 MG capsule Take  2 mg by mouth at bedtime. 06/04/22   [provider]  prazosin (MINIPRESS) 5 MG capsule Take 5 mg by mouth at bedtime.    [provider]  promethazine (PHENERGAN) 25 MG tablet Take 25 mg by mouth every 8 (eight) hours as needed for nausea. Patient not taking: Reported on 11/30/2022 02/10/21   [provider]  sertraline (ZOLOFT) 100 MG tablet Take 100 mg by mouth at bedtime. Patient not taking: Reported on 11/30/2022 03/16/21   [provider]  traZODone (DESYREL) 150 MG tablet Take 1 tablet (150 mg total) by mouth at  bedtime. 05/29/21   Lamar Sprinkles, MD      Allergies    Lithium, Benztropine, and Depakote [divalproex sodium]    Review of Systems   Review of Systems  Constitutional:  Negative for fever.  HENT:  Negative for nosebleeds.   Eyes:  Negative for pain and visual disturbance.  Respiratory:  Negative for shortness of breath.   Cardiovascular:  Negative for chest pain.  Gastrointestinal:  Negative for abdominal pain and vomiting.  Genitourinary:  Negative for flank pain.  Musculoskeletal:  Negative for back pain and neck pain.  Skin:  Positive for wound.  Neurological:  Negative for weakness and numbness.  Hematological:  Does not bruise/bleed easily.  Psychiatric/Behavioral:  Negative for confusion.     Physical Exam Updated Vital Signs BP 130/73   Pulse 91   Temp (!) 97.5 F (36.4 C) (Oral)   Resp 19   Ht 1.727 m (5\' 8" )   Wt 124.7 kg   SpO2 96%   BMI 41.81 kg/m  Physical Exam Vitals and nursing note reviewed.  Constitutional:      Appearance: Normal appearance. He is well-developed.  HENT:     Head:     Comments: Contusion head/scalp.     Nose: Nose normal.     Mouth/Throat:     Mouth: Mucous membranes are moist.     Pharynx: Oropharynx is clear.  Eyes:     General: No scleral icterus.    Conjunctiva/sclera: Conjunctivae normal.     Pupils: Pupils are equal, round, and reactive to light.  Neck:     Vascular: No carotid bruit.     Trachea: No tracheal deviation.  Cardiovascular:     Rate and Rhythm: Normal rate and regular rhythm.     Pulses: Normal pulses.     Heart sounds: Normal heart sounds. No murmur heard.    No friction rub. No gallop.  Pulmonary:     Effort: Pulmonary effort is normal. No accessory muscle usage or respiratory distress.     Breath sounds: Normal breath sounds.     Comments: Mild right lateral chest wall tenderness, no crepitus.  Abdominal:     General: Bowel sounds are normal. There is no distension.     Palpations: Abdomen is  soft.     Tenderness: There is no guarding.     Comments: Upper abd tenderness.   Genitourinary:    Comments: No cva tenderness. Musculoskeletal:        General: No swelling.     Cervical back: Normal range of motion and neck supple. No rigidity.     Comments: Mid cervical tenderness, otherwise, CTLS spine, non tender, aligned, no step off. Pelvis stable. Good passive rom bil extremities without pain or focal bony tenderness. Superficial abrasions bilateral anterior lower legs. Distal pulses palp bil.   Skin:    General: Skin is warm and dry.  Findings: No rash.  Neurological:     Mental Status: He is alert.     Comments: Alert, speech clear. GCS 15. Motor/sens grossly intact bil.   Psychiatric:        Mood and Affect: Mood normal.     ED Results / Procedures / Treatments   Labs (all labs ordered are listed, but only abnormal results are displayed) Results for orders placed or performed during the hospital encounter of 12/22/22  Comprehensive metabolic panel  Result Value Ref Range   Sodium 138 135 - 145 mmol/L   Potassium 4.4 3.5 - 5.1 mmol/L   Chloride 99 98 - 111 mmol/L   CO2 29 22 - 32 mmol/L   Glucose, Bld 204 (H) 70 - 99 mg/dL   BUN 11 6 - 20 mg/dL   Creatinine, Ser 1.61 (H) 0.61 - 1.24 mg/dL   Calcium 09.6 8.9 - 04.5 mg/dL   Total Protein 7.6 6.5 - 8.1 g/dL   Albumin 4.1 3.5 - 5.0 g/dL   AST 409 (H) 15 - 41 U/L   ALT 199 (H) 0 - 44 U/L   Alkaline Phosphatase 94 38 - 126 U/L   Total Bilirubin 0.8 0.3 - 1.2 mg/dL   GFR, Estimated >81 >19 mL/min   Anion gap 10 5 - 15  CBC  Result Value Ref Range   WBC 11.4 (H) 4.0 - 10.5 K/uL   RBC 6.02 (H) 4.22 - 5.81 MIL/uL   Hemoglobin 14.5 13.0 - 17.0 g/dL   HCT 14.7 82.9 - 56.2 %   MCV 77.6 (L) 80.0 - 100.0 fL   MCH 24.1 (L) 26.0 - 34.0 pg   MCHC 31.0 30.0 - 36.0 g/dL   RDW 13.0 (H) 86.5 - 78.4 %   Platelets 308 150 - 400 K/uL   nRBC 0.0 0.0 - 0.2 %  I-Stat Chem 8, ED  Result Value Ref Range   Sodium 139 135 - 145  mmol/L   Potassium 4.4 3.5 - 5.1 mmol/L   Chloride 99 98 - 111 mmol/L   BUN 11 6 - 20 mg/dL   Creatinine, Ser 6.96 (H) 0.61 - 1.24 mg/dL   Glucose, Bld 295 (H) 70 - 99 mg/dL   Calcium, Ion 2.84 1.32 - 1.40 mmol/L   TCO2 30 22 - 32 mmol/L   Hemoglobin 15.3 13.0 - 17.0 g/dL   HCT 44.0 10.2 - 72.5 %   CT CHEST ABDOMEN PELVIS W CONTRAST  Result Date: 12/22/2022 CLINICAL DATA:  Polytrauma, blunt EXAM: CT CHEST, ABDOMEN, AND PELVIS WITH CONTRAST TECHNIQUE: Multidetector CT imaging of the chest, abdomen and pelvis was performed following the standard protocol during bolus administration of intravenous contrast. RADIATION DOSE REDUCTION: This exam was performed according to the departmental dose-optimization program which includes automated exposure control, adjustment of the mA and/or kV according to patient size and/or use of iterative reconstruction technique. CONTRAST:  75mL OMNIPAQUE IOHEXOL 350 MG/ML SOLN COMPARISON:  CT abdomen pelvis 11/01/2020 FINDINGS: CHEST: Cardiovascular: No aortic injury. The thoracic aorta is normal in caliber. The heart is normal in size. No significant pericardial effusion. Mediastinum/Nodes: No pneumomediastinum. No mediastinal hematoma. The esophagus is unremarkable. The thyroid is unremarkable. The central airways are patent. No mediastinal, hilar, or axillary lymphadenopathy. Lungs/Pleura: No focal consolidation. No pulmonary nodule. No pulmonary mass. No pulmonary contusion or laceration. No pneumatocele formation. No pleural effusion. No pneumothorax. No hemothorax. Musculoskeletal/Chest wall: No chest wall mass. Acute nondisplaced fracture of the right anterolateral second rib. Acute half shaft width displaced  posterior right fifth rib fracture. No acute sternal fracture. No spinal fracture. ABDOMEN / PELVIS: Hepatobiliary: Not enlarged. No focal lesion. No laceration or subcapsular hematoma. Status post cholecystectomy.  No biliary ductal dilatation. Pancreas: Normal  pancreatic contour. No main pancreatic duct dilatation. Spleen: Pain spleen is enlarged measuring up to 14 cm. No focal lesion. No laceration, subcapsular hematoma, or vascular injury. Adrenals/Urinary Tract: Fat stranding of the left adrenal gland nodule. No nodularity bilaterally. bilateral kidneys enhance symmetrically. No hydronephrosis. No contusion, laceration, or subcapsular hematoma. Subcentimeter hypodensities are too small to characterize. No injury to the vascular structures or collecting systems. No hydroureter. The urinary bladder is unremarkable. Stomach/Bowel: No small or large bowel wall thickening or dilatation. Colonic diverticulosis. The appendix is unremarkable. Vasculature/Lymphatics: Atherosclerotic plaque. No abdominal aorta or iliac aneurysm. No active contrast extravasation or pseudoaneurysm. No abdominal, pelvic, inguinal lymphadenopathy. Reproductive: Prostate is unremarkable. Other: No simple free fluid ascites. No pneumoperitoneum. No hemoperitoneum. No mesenteric hematoma identified. No organized fluid collection. Musculoskeletal: No significant soft tissue hematoma. Acute minimally displaced right L1 through L5 transverse process fractures. Chronic bilateral L5 pars interarticularis defects. No spinal fracture. Ports and Devices: None. IMPRESSION: 1. Acute nondisplaced fracture of the right anterolateral second rib. Acute half shaft width displaced posterior right fifth rib fracture. No associated pneumothorax. 2. Fat stranding of the left adrenal gland nodule suggestive of adrenal injury/hematoma. No active hemorrhage identified. 3. No acute intrathoracic or intrapelvic traumatic injury. 4. No acute fracture or traumatic malalignment of the thoracic spine. 5. Acute minimally displaced right L1 through L5 transverse process fractures. 6.  Aortic Atherosclerosis (ICD10-I70.0). Electronically Signed   By: Tish Frederickson M.D.   On: 12/22/2022 20:02   CT HEAD WO CONTRAST  Result Date:  12/22/2022 CLINICAL DATA:  Head trauma, moderate-severe; Polytrauma, blunt EXAM: CT HEAD WITHOUT CONTRAST CT CERVICAL SPINE WITHOUT CONTRAST TECHNIQUE: Multidetector CT imaging of the head and cervical spine was performed following the standard protocol without intravenous contrast. Multiplanar CT image reconstructions of the cervical spine were also generated. RADIATION DOSE REDUCTION: This exam was performed according to the departmental dose-optimization program which includes automated exposure control, adjustment of the mA and/or kV according to patient size and/or use of iterative reconstruction technique. COMPARISON:  None Available. FINDINGS: CT HEAD FINDINGS Brain: No evidence of large-territorial acute infarction. No parenchymal hemorrhage. No mass lesion. No extra-axial collection. No mass effect or midline shift. No hydrocephalus. Basilar cisterns are patent. Vascular: No hyperdense vessel. Skull: No acute fracture or focal lesion. Sinuses/Orbits: Left maxillary sinus mucosal thickening. Otherwise paranasal sinuses and mastoid air cells are clear. The orbits are unremarkable. Other: None. CT CERVICAL SPINE FINDINGS Alignment: Normal. Skull base and vertebrae: No acute fracture. No aggressive appearing focal osseous lesion or focal pathologic process. Soft tissues and spinal canal: No prevertebral fluid or swelling. No visible canal hematoma. Upper chest: Unremarkable. Other: Acute nondisplaced fracture of the right anterolateral second rib. IMPRESSION: 1. No acute intracranial abnormality. 2. No acute displaced fracture or traumatic listhesis of the cervical spine. 3. Acute nondisplaced fracture of the right anterolateral second rib. Electronically Signed   By: Tish Frederickson M.D.   On: 12/22/2022 19:47   CT CERVICAL SPINE WO CONTRAST  Result Date: 12/22/2022 CLINICAL DATA:  Head trauma, moderate-severe; Polytrauma, blunt EXAM: CT HEAD WITHOUT CONTRAST CT CERVICAL SPINE WITHOUT CONTRAST TECHNIQUE:  Multidetector CT imaging of the head and cervical spine was performed following the standard protocol without intravenous contrast. Multiplanar CT image reconstructions of the cervical spine were  also generated. RADIATION DOSE REDUCTION: This exam was performed according to the departmental dose-optimization program which includes automated exposure control, adjustment of the mA and/or kV according to patient size and/or use of iterative reconstruction technique. COMPARISON:  None Available. FINDINGS: CT HEAD FINDINGS Brain: No evidence of large-territorial acute infarction. No parenchymal hemorrhage. No mass lesion. No extra-axial collection. No mass effect or midline shift. No hydrocephalus. Basilar cisterns are patent. Vascular: No hyperdense vessel. Skull: No acute fracture or focal lesion. Sinuses/Orbits: Left maxillary sinus mucosal thickening. Otherwise paranasal sinuses and mastoid air cells are clear. The orbits are unremarkable. Other: None. CT CERVICAL SPINE FINDINGS Alignment: Normal. Skull base and vertebrae: No acute fracture. No aggressive appearing focal osseous lesion or focal pathologic process. Soft tissues and spinal canal: No prevertebral fluid or swelling. No visible canal hematoma. Upper chest: Unremarkable. Other: Acute nondisplaced fracture of the right anterolateral second rib. IMPRESSION: 1. No acute intracranial abnormality. 2. No acute displaced fracture or traumatic listhesis of the cervical spine. 3. Acute nondisplaced fracture of the right anterolateral second rib. Electronically Signed   By: Tish Frederickson M.D.   On: 12/22/2022 19:47   DG Chest Port 1 View  Result Date: 12/22/2022 CLINICAL DATA:  Fall onto back EXAM: PORTABLE CHEST 1 VIEW COMPARISON:  Chest radiograph dated 08/18/2022 FINDINGS: Normal lung volumes. Bibasilar patchy opacities. No pleural effusion or pneumothorax. The heart size and mediastinal contours are within normal limits. Minimally displaced right posterior  fifth rib fracture. IMPRESSION: 1. Minimally displaced right posterior fifth rib fracture. 2. Bibasilar patchy opacities, likely atelectasis. Electronically Signed   By: Agustin Cree M.D.   On: 12/22/2022 19:13   DG Pelvis Portable  Result Date: 12/22/2022 CLINICAL DATA:  Fall EXAM: PORTABLE PELVIS 1-2 VIEWS COMPARISON:  None Available. FINDINGS: There is no evidence of pelvic fracture or diastasis. No pelvic bone lesions are seen. IMPRESSION: Negative. Electronically Signed   By: Charlett Nose M.D.   On: 12/22/2022 19:10   NM GASTRIC EMPTYING  Result Date: 12/21/2022 CLINICAL DATA:  Gastroparesis suspected. EXAM: NUCLEAR MEDICINE GASTRIC EMPTYING SCAN TECHNIQUE: After oral ingestion of radiolabeled meal, sequential abdominal images were obtained for 4 hours. Percentage of activity emptying the stomach was calculated at 1 hour, 2 hour, 3 hour, and 4 hours. RADIOPHARMACEUTICALS:  2.1 mCi Tc-19m sulfur colloid in standardized meal COMPARISON:  Gastric emptying study December 03, 2021 FINDINGS: Expected location of the stomach in the left upper quadrant. Ingested meal empties the stomach slowly and incompletely over the course of the study. 1% emptied at 1 hr ( normal >= 10%) 7% emptied at 2 hr ( normal >= 40%) 23% emptied at 3 hr ( normal >= 70%) 48% emptied at 4 hr ( normal >= 90%) IMPRESSION: Scintigraphic findings compatible with delayed gastric emptying. Electronically Signed   By: Maudry Mayhew M.D.   On: 12/21/2022 14:35   XR Knee 1-2 Views Right  Result Date: 12/13/2022 2 view radiographs of the right knee shows no osteophytic bone spurs no subcondylar sclerosis or cysts.  The joint spaces are congruent.    EKG None  Radiology CT CHEST ABDOMEN PELVIS W CONTRAST  Result Date: 12/22/2022 CLINICAL DATA:  Polytrauma, blunt EXAM: CT CHEST, ABDOMEN, AND PELVIS WITH CONTRAST TECHNIQUE: Multidetector CT imaging of the chest, abdomen and pelvis was performed following the standard protocol during bolus  administration of intravenous contrast. RADIATION DOSE REDUCTION: This exam was performed according to the departmental dose-optimization program which includes automated exposure control, adjustment of the mA  and/or kV according to patient size and/or use of iterative reconstruction technique. CONTRAST:  75mL OMNIPAQUE IOHEXOL 350 MG/ML SOLN COMPARISON:  CT abdomen pelvis 11/01/2020 FINDINGS: CHEST: Cardiovascular: No aortic injury. The thoracic aorta is normal in caliber. The heart is normal in size. No significant pericardial effusion. Mediastinum/Nodes: No pneumomediastinum. No mediastinal hematoma. The esophagus is unremarkable. The thyroid is unremarkable. The central airways are patent. No mediastinal, hilar, or axillary lymphadenopathy. Lungs/Pleura: No focal consolidation. No pulmonary nodule. No pulmonary mass. No pulmonary contusion or laceration. No pneumatocele formation. No pleural effusion. No pneumothorax. No hemothorax. Musculoskeletal/Chest wall: No chest wall mass. Acute nondisplaced fracture of the right anterolateral second rib. Acute half shaft width displaced posterior right fifth rib fracture. No acute sternal fracture. No spinal fracture. ABDOMEN / PELVIS: Hepatobiliary: Not enlarged. No focal lesion. No laceration or subcapsular hematoma. Status post cholecystectomy.  No biliary ductal dilatation. Pancreas: Normal pancreatic contour. No main pancreatic duct dilatation. Spleen: Pain spleen is enlarged measuring up to 14 cm. No focal lesion. No laceration, subcapsular hematoma, or vascular injury. Adrenals/Urinary Tract: Fat stranding of the left adrenal gland nodule. No nodularity bilaterally. bilateral kidneys enhance symmetrically. No hydronephrosis. No contusion, laceration, or subcapsular hematoma. Subcentimeter hypodensities are too small to characterize. No injury to the vascular structures or collecting systems. No hydroureter. The urinary bladder is unremarkable. Stomach/Bowel: No  small or large bowel wall thickening or dilatation. Colonic diverticulosis. The appendix is unremarkable. Vasculature/Lymphatics: Atherosclerotic plaque. No abdominal aorta or iliac aneurysm. No active contrast extravasation or pseudoaneurysm. No abdominal, pelvic, inguinal lymphadenopathy. Reproductive: Prostate is unremarkable. Other: No simple free fluid ascites. No pneumoperitoneum. No hemoperitoneum. No mesenteric hematoma identified. No organized fluid collection. Musculoskeletal: No significant soft tissue hematoma. Acute minimally displaced right L1 through L5 transverse process fractures. Chronic bilateral L5 pars interarticularis defects. No spinal fracture. Ports and Devices: None. IMPRESSION: 1. Acute nondisplaced fracture of the right anterolateral second rib. Acute half shaft width displaced posterior right fifth rib fracture. No associated pneumothorax. 2. Fat stranding of the left adrenal gland nodule suggestive of adrenal injury/hematoma. No active hemorrhage identified. 3. No acute intrathoracic or intrapelvic traumatic injury. 4. No acute fracture or traumatic malalignment of the thoracic spine. 5. Acute minimally displaced right L1 through L5 transverse process fractures. 6.  Aortic Atherosclerosis (ICD10-I70.0). Electronically Signed   By: Tish Frederickson M.D.   On: 12/22/2022 20:02   CT HEAD WO CONTRAST  Result Date: 12/22/2022 CLINICAL DATA:  Head trauma, moderate-severe; Polytrauma, blunt EXAM: CT HEAD WITHOUT CONTRAST CT CERVICAL SPINE WITHOUT CONTRAST TECHNIQUE: Multidetector CT imaging of the head and cervical spine was performed following the standard protocol without intravenous contrast. Multiplanar CT image reconstructions of the cervical spine were also generated. RADIATION DOSE REDUCTION: This exam was performed according to the departmental dose-optimization program which includes automated exposure control, adjustment of the mA and/or kV according to patient size and/or use of  iterative reconstruction technique. COMPARISON:  None Available. FINDINGS: CT HEAD FINDINGS Brain: No evidence of large-territorial acute infarction. No parenchymal hemorrhage. No mass lesion. No extra-axial collection. No mass effect or midline shift. No hydrocephalus. Basilar cisterns are patent. Vascular: No hyperdense vessel. Skull: No acute fracture or focal lesion. Sinuses/Orbits: Left maxillary sinus mucosal thickening. Otherwise paranasal sinuses and mastoid air cells are clear. The orbits are unremarkable. Other: None. CT CERVICAL SPINE FINDINGS Alignment: Normal. Skull base and vertebrae: No acute fracture. No aggressive appearing focal osseous lesion or focal pathologic process. Soft tissues and spinal canal: No prevertebral fluid  or swelling. No visible canal hematoma. Upper chest: Unremarkable. Other: Acute nondisplaced fracture of the right anterolateral second rib. IMPRESSION: 1. No acute intracranial abnormality. 2. No acute displaced fracture or traumatic listhesis of the cervical spine. 3. Acute nondisplaced fracture of the right anterolateral second rib. Electronically Signed   By: Tish Frederickson M.D.   On: 12/22/2022 19:47   CT CERVICAL SPINE WO CONTRAST  Result Date: 12/22/2022 CLINICAL DATA:  Head trauma, moderate-severe; Polytrauma, blunt EXAM: CT HEAD WITHOUT CONTRAST CT CERVICAL SPINE WITHOUT CONTRAST TECHNIQUE: Multidetector CT imaging of the head and cervical spine was performed following the standard protocol without intravenous contrast. Multiplanar CT image reconstructions of the cervical spine were also generated. RADIATION DOSE REDUCTION: This exam was performed according to the departmental dose-optimization program which includes automated exposure control, adjustment of the mA and/or kV according to patient size and/or use of iterative reconstruction technique. COMPARISON:  None Available. FINDINGS: CT HEAD FINDINGS Brain: No evidence of large-territorial acute infarction. No  parenchymal hemorrhage. No mass lesion. No extra-axial collection. No mass effect or midline shift. No hydrocephalus. Basilar cisterns are patent. Vascular: No hyperdense vessel. Skull: No acute fracture or focal lesion. Sinuses/Orbits: Left maxillary sinus mucosal thickening. Otherwise paranasal sinuses and mastoid air cells are clear. The orbits are unremarkable. Other: None. CT CERVICAL SPINE FINDINGS Alignment: Normal. Skull base and vertebrae: No acute fracture. No aggressive appearing focal osseous lesion or focal pathologic process. Soft tissues and spinal canal: No prevertebral fluid or swelling. No visible canal hematoma. Upper chest: Unremarkable. Other: Acute nondisplaced fracture of the right anterolateral second rib. IMPRESSION: 1. No acute intracranial abnormality. 2. No acute displaced fracture or traumatic listhesis of the cervical spine. 3. Acute nondisplaced fracture of the right anterolateral second rib. Electronically Signed   By: Tish Frederickson M.D.   On: 12/22/2022 19:47   DG Chest Port 1 View  Result Date: 12/22/2022 CLINICAL DATA:  Fall onto back EXAM: PORTABLE CHEST 1 VIEW COMPARISON:  Chest radiograph dated 08/18/2022 FINDINGS: Normal lung volumes. Bibasilar patchy opacities. No pleural effusion or pneumothorax. The heart size and mediastinal contours are within normal limits. Minimally displaced right posterior fifth rib fracture. IMPRESSION: 1. Minimally displaced right posterior fifth rib fracture. 2. Bibasilar patchy opacities, likely atelectasis. Electronically Signed   By: Agustin Cree M.D.   On: 12/22/2022 19:13   DG Pelvis Portable  Result Date: 12/22/2022 CLINICAL DATA:  Fall EXAM: PORTABLE PELVIS 1-2 VIEWS COMPARISON:  None Available. FINDINGS: There is no evidence of pelvic fracture or diastasis. No pelvic bone lesions are seen. IMPRESSION: Negative. Electronically Signed   By: Charlett Nose M.D.   On: 12/22/2022 19:10   NM GASTRIC EMPTYING  Result Date:  12/21/2022 CLINICAL DATA:  Gastroparesis suspected. EXAM: NUCLEAR MEDICINE GASTRIC EMPTYING SCAN TECHNIQUE: After oral ingestion of radiolabeled meal, sequential abdominal images were obtained for 4 hours. Percentage of activity emptying the stomach was calculated at 1 hour, 2 hour, 3 hour, and 4 hours. RADIOPHARMACEUTICALS:  2.1 mCi Tc-65m sulfur colloid in standardized meal COMPARISON:  Gastric emptying study December 03, 2021 FINDINGS: Expected location of the stomach in the left upper quadrant. Ingested meal empties the stomach slowly and incompletely over the course of the study. 1% emptied at 1 hr ( normal >= 10%) 7% emptied at 2 hr ( normal >= 40%) 23% emptied at 3 hr ( normal >= 70%) 48% emptied at 4 hr ( normal >= 90%) IMPRESSION: Scintigraphic findings compatible with delayed gastric emptying. Electronically Signed  By: Maudry Mayhew M.D.   On: 12/21/2022 14:35    Procedures Procedures    Medications Ordered in ED Medications  HYDROmorphone (DILAUDID) injection 1 mg (1 mg Intravenous Given 12/22/22 1811)  ondansetron (ZOFRAN) injection 4 mg (4 mg Intravenous Given 12/22/22 1812)  Tdap (BOOSTRIX) injection 0.5 mL (0.5 mLs Intramuscular Given 12/22/22 1812)  iohexol (OMNIPAQUE) 350 MG/ML injection 75 mL (75 mLs Intravenous Contrast Given 12/22/22 1932)  HYDROmorphone (DILAUDID) injection 1 mg (1 mg Intravenous Given 12/22/22 2025)    ED Course/ Medical Decision Making/ A&P                             Medical Decision Making Problems Addressed: Abrasion, multiple sites: acute illness or injury Closed fracture of multiple ribs of right side, initial encounter: acute illness or injury Closed fracture of transverse process of lumbar vertebra, initial encounter [facility]): acute illness or injury Contusion of adrenal gland, initial encounter: acute illness or injury Contusion of head, initial encounter: acute illness or injury with systemic symptoms that poses a threat to life or bodily  functions Contusion, back, right, initial encounter: acute illness or injury Fall from roof, initial encounter: acute illness or injury with systemic symptoms that poses a threat to life or bodily functions  Amount and/or Complexity of Data Reviewed Independent Historian: EMS    Details: hx External Data Reviewed: notes. Labs: ordered. Decision-making details documented in ED Course. Radiology: ordered and independent interpretation performed. Decision-making details documented in ED Course. Discussion of management or test interpretation with external provider(s): Trauma, ns.   Risk Prescription drug management. Parenteral controlled substances. Decision regarding hospitalization.   Iv ns. Continuous pulse ox and cardiac monitoring. Labs ordered/sent. Imaging ordered.   Differential diagnosis includes head injury, chest/abd injury, etc. Dispo decision including potential need for admission considered - will get labs and imaging and reassess.   Reviewed nursing notes and prior charts for additional history. External reports reviewed. Additional history from: EMS.  Cardiac monitor: sinus rhythm, rate 90.  Labs reviewed/interpreted by me -   Xrays reviewed/interpreted by me -   Abrasions cleaned, sterile dressings. Tetanus IM. Dilaudid iv. Zofran iv.   CT reviewed/interpreted by me - lumbar transverse process fxs. Right 2nd and 5th rib fractures. Dilaudid iv. Incentive spirometer for home use.   Pain improved.   Ns consulted, discussed lumbar transverse process fxs with Dr Franky Macho - he indicates no specific bracing or tx needed, pain control.   With rib and lumbar fxs, on recheck, pulse ox 87% on room air, on 2 liters 96%. Former smoker. No large pulm contusion on ct. Given fxs, mild hypoxia, will consult trauma for obs/admit.   CRITICAL CARE RE: fall from roof, multiple right sided rib fractures, right transverse process fx, adrenal contusion.  Performed by: Suzi Roots Total critical care time: 45 minutes Critical care time was exclusive of separately billable procedures and treating other patients. Critical care was necessary to treat or prevent imminent or life-threatening deterioration. Critical care was time spent personally by me on the following activities: development of treatment plan with patient and/or surrogate as well as nursing, discussions with consultants, evaluation of patient's response to treatment, examination of patient, obtaining history from patient or surrogate, ordering and performing treatments and interventions, ordering and review of laboratory studies, ordering and review of radiographic studies, pulse oximetry and re-evaluation of patient's condition.          Final Clinical  Impression(s) / ED Diagnoses Final diagnoses:  Fall from roof, initial encounter  Contusion of head, initial encounter  Contusion, back, right, initial encounter  Abrasion, multiple sites  Contusion of adrenal gland, initial encounter  Closed fracture of transverse process of lumbar vertebra, initial encounter [facility])  Closed fracture of multiple ribs of right side, initial encounter    Rx / DC Orders ED Discharge Orders     None         Cathren Laine, MD 12/22/22 2042

## 2022-12-22 NOTE — ED Notes (Signed)
Pt o2 dropping after dilaudid, placed on 2L [place]. Dr. Denton Lank aware.
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
## 2022-12-22 NOTE — TOC CAGE-AID Note (Signed)
Transition of Care [facility]) - CAGE-AID Screening   Patient Details  Name: Leon Ross MRN: [id number] Date of Birth: [birth year]  Transition of Care [facility]) CM/SW Contact:    Leota Sauers, RN Phone Number: 12/22/2022, 11:04 PM   Clinical Narrative:  Patient denies alcohol or drug use, education not offered at this time.  CAGE-AID Screening:    Have You Ever Felt You Ought to Cut Down on Your Drinking or Drug Use?: No Have People Annoyed You By Critizing Your Drinking Or Drug Use?: No Have You Felt Bad Or Guilty About Your Drinking Or Drug Use?: No Have You Ever Had a Drink or Used Drugs First Thing In The Morning to Steady Your Nerves or to Get Rid of a Hangover?: No CAGE-AID Score: 0  Substance Abuse Education Offered: No

## 2022-12-22 NOTE — ED Triage Notes (Signed)
Pt reports that he was standing approximately 8-10 feet above the ground trying to put a tarp on a roof when he fell onto the concrete, landing on his back. Pt denies LOC. No blood thinners. Pt c/o posterior head pain, bilateral shins, and the R side of his thoracic spine.

## 2022-12-22 NOTE — Discharge Instructions (Addendum)
It was our pleasure to provide your ER care today - we hope that you feel better.  Take acetaminophen as need for pain. You may also take ultram as need for pain - no driving for the next 6 hours or when taking ultram.   Keep abrasions  very clean - wash with soap and water 2x/day. Change dressing daily and as need.   Return to ER if worse, new symptoms, new/severe pain, trouble breathing, infection of wounds, or other concern.   RIB FRACTURES  HOME INSTRUCTIONS   PAIN CONTROL:  Pain is best controlled by a usual combination of three different methods TOGETHER:  Ice/Heat Over the counter pain medication Prescription pain medication You may experience some swelling and bruising in area of broken ribs. Ice packs or heating pads (30-60 minutes up to 6 times a day) will help. Use ice for the first few days to help decrease swelling and bruising, then switch to heat to help relax tight/sore spots and speed recovery. Some people prefer to use ice alone, heat alone, alternating between ice & heat. Experiment to what works for you. Swelling and bruising can take several weeks to resolve.  It is helpful to take an over-the-counter pain medication regularly for the first few weeks. Choose one of the following that works best for you:  Naproxen (Aleve, etc) Two 220mg  tabs twice a day Ibuprofen (Advil, etc) Three 200mg  tabs four times a day (every meal & bedtime) Acetaminophen (Tylenol, etc) 500-650mg  four times a day (every meal & bedtime) A prescription for pain medication (such as oxycodone, hydrocodone, etc) may be given to you upon discharge. Take your pain medication as prescribed.  If you are having problems/concerns with the prescription medicine (does not control pain, nausea, vomiting, rash, itching, etc), please call us [phone] to see if we need to switch you to a different pain medicine that will work better for you and/or control your side effect better. If you need a refill on your  pain medication, please contact your pharmacy. They will contact our office to request authorization. Prescriptions will not be filled after 5 pm or on week-ends. Avoid getting constipated. When taking pain medications, it is common to experience some constipation. Increasing fluid intake and taking a fiber supplement (such as Metamucil, Citrucel, FiberCon, MiraLax, etc) 1-2 times a day regularly will usually help prevent this problem from occurring. A mild laxative (prune juice, Milk of Magnesia, MiraLax, etc) should be taken according to package directions if there are no bowel movements after 48 hours.  Watch out for diarrhea. If you have many loose bowel movements, simplify your diet to bland foods & liquids for a few days. Stop any stool softeners and decrease your fiber supplement. Switching to mild anti-diarrheal medications (Kayopectate, Pepto Bismol) can help. If this worsens or does not improve, please call us. FOLLOW UP  If a follow up appointment is needed one will be scheduled for you. If none is needed with our trauma team, please follow up with your primary care provider within 2-3 weeks from discharge. Please call [facility] at [phone] if you have any questions about follow up.  If you have any orthopedic or other injuries you will need to follow up as outlined in your follow up instructions.   WHEN TO CALL us [phone]:  Poor pain control Reactions / problems with new medications (rash/itching, nausea, etc)  Fever over 101.5 F (38.5 C) Worsening swelling or bruising Worsening pain, productive cough, difficulty breathing or  any other concerning symptoms  The clinic staff is available to answer your questions during regular business hours (8:30am-5pm). Please don't hesitate to call and ask to speak to one of our nurses for clinical concerns.  If you have a medical emergency, go to the nearest emergency room or call 911.  A surgeon from [facility] is always on call at  the [facility], [place]  [address], [place], [place] [address] ?  MAIN: [phone] ? TOLL FREE: [phone] ?  FAX [phone]  [email or web address]      Information on Rib Fractures  A rib fracture is a break or crack in one of the bones of the ribs. The ribs are long, curved bones that wrap around your chest and attach to your spine and your breastbone. The ribs protect your heart, lungs, and other organs in the chest. A broken or cracked rib is often painful but is not usually serious. Most rib fractures heal on their own over time. However, rib fractures can be more serious if multiple ribs are broken or if broken ribs move out of place and push against other structures or organs. What are the causes? This condition is caused by: Repetitive movements with high force, such as pitching a baseball or having severe coughing spells. A direct blow to the chest, such as a sports injury, a car accident, or a fall. Cancer that has spread to the bones, which can weaken bones and cause them to break. What are the signs or symptoms? Symptoms of this condition include: Pain when you breathe in or cough. Pain when someone presses on the injured area. Feeling short of breath. How is this diagnosed? This condition is diagnosed with a physical exam and medical history. Imaging tests may also be done, such as: Chest X-ray. CT scan. MRI. Bone scan. Chest ultrasound. How is this treated? Treatment for this condition depends on the severity of the fracture. Most rib fractures usually heal on their own in 1-3 months. Sometimes healing takes longer if there is a cough that does not stop or if there are other activities that make the injury worse (aggravating factors). While you heal, you will be given medicines to control the pain. You will also be taught deep breathing exercises. Severe injuries may require hospitalization or  surgery. Follow these instructions at home: Managing pain, stiffness, and swelling If directed, apply ice to the injured area. Put ice in a plastic bag. Place a towel between your skin and the bag. Leave the ice on for 20 minutes, 2-3 times a day. Take over-the-counter and prescription medicines only as told by your health care provider. Activity Avoid a lot of activity and any activities or movements that cause pain. Be careful during activities and avoid bumping the injured rib. Slowly increase your activity as told by your health care provider. General instructions Do deep breathing exercises as told by your health care provider. This helps prevent pneumonia, which is a common complication of a broken rib. Your health care provider may instruct you to: Take deep breaths several times a day. Try to cough several times a day, holding a pillow against the injured area. Use a device called incentive spirometer to practice deep breathing several times a day. Drink enough fluid to keep your urine pale yellow. Do not wear a rib belt or binder. These restrict breathing, which can lead to pneumonia. Keep all follow-up visits as told by your health care  provider. This is important. Contact a health care provider if: You have a fever. Get help right away if: You have difficulty breathing or you are short of breath. You develop a cough that does not stop, or you cough up thick or bloody sputum. You have nausea, vomiting, or pain in your abdomen. Your pain gets worse and medicine does not help. Summary A rib fracture is a break or crack in one of the bones of the ribs. A broken or cracked rib is often painful but is not usually serious. Most rib fractures heal on their own over time. Treatment for this condition depends on the severity of the fracture. Avoid a lot of activity and any activities or movements that cause pain. This information is not intended to replace advice given to you by your  health care provider. Make sure you discuss any questions you have with your health care provider. Document Released: 07/26/2005 Document Revised: 10/25/2016 Document Reviewed: 10/25/2016 Elsevier Interactive Patient Education  2019 [employer].

## 2022-12-22 NOTE — ED Notes (Signed)
error 

## 2022-12-23 LAB — BASIC METABOLIC PANEL
Anion gap: 11 (ref 5–15)
BUN: 9 mg/dL (ref 6–20)
CO2: 26 mmol/L (ref 22–32)
Calcium: 9.4 mg/dL (ref 8.9–10.3)
Chloride: 99 mmol/L (ref 98–111)
Creatinine, Ser: 1.24 mg/dL (ref 0.61–1.24)
GFR, Estimated: >60 mL/min (ref >60)
Glucose, Bld: 190 mg/dL — ABNORMAL HIGH (ref 70–99)
Potassium: 4.2 mmol/L (ref 3.5–5.1)
Sodium: 136 mmol/L (ref 135–145)

## 2022-12-23 LAB — CBC
HCT: 38.1 % — ABNORMAL LOW (ref 39.0–52.0)
HCT: 39.5 % (ref 39.0–52.0)
Hemoglobin: 12 g/dL — ABNORMAL LOW (ref 13.0–17.0)
Hemoglobin: 12.5 g/dL — ABNORMAL LOW (ref 13.0–17.0)
MCH: 24 pg — ABNORMAL LOW (ref 26.0–34.0)
MCH: 24.1 pg — ABNORMAL LOW (ref 26.0–34.0)
MCHC: 31.5 g/dL (ref 30.0–36.0)
MCHC: 31.6 g/dL (ref 30.0–36.0)
MCV: 76.4 fL — ABNORMAL LOW (ref 80.0–100.0)
MCV: 76.1 fL — ABNORMAL LOW (ref 80.0–100.0)
Platelets: 279 10*3/uL (ref 150–400)
Platelets: 286 10*3/uL (ref 150–400)
RBC: 4.99 MIL/uL (ref 4.22–5.81)
RBC: 5.19 MIL/uL (ref 4.22–5.81)
RDW: 16.1 % — ABNORMAL HIGH (ref 11.5–15.5)
RDW: 16.2 % — ABNORMAL HIGH (ref 11.5–15.5)
WBC: 13.6 10*3/uL — ABNORMAL HIGH (ref 4.0–10.5)
WBC: 10.5 10*3/uL (ref 4.0–10.5)
nRBC: 0 % (ref 0.0–0.2)
nRBC: 0 % (ref 0.0–0.2)

## 2022-12-23 LAB — SAMPLE TO BLOOD BANK

## 2022-12-23 LAB — GLUCOSE, CAPILLARY
Glucose-Capillary: 164 mg/dL — ABNORMAL HIGH (ref 70–99)
Glucose-Capillary: 200 mg/dL — ABNORMAL HIGH (ref 70–99)
Glucose-Capillary: 207 mg/dL — ABNORMAL HIGH (ref 70–99)
Glucose-Capillary: 242 mg/dL — ABNORMAL HIGH (ref 70–99)

## 2022-12-23 LAB — HEMOGLOBIN A1C
Hgb A1c MFr Bld: 7.4 % — ABNORMAL HIGH (ref 4.8–5.6)
Mean Plasma Glucose: 165.68 mg/dL

## 2022-12-23 MED ORDER — INSULIN ASPART 100 UNIT/ML IJ SOLN
0.0000 [IU] | Freq: Every day | INTRAMUSCULAR | Status: DC
  Administered 2022-12-23 – 2022-12-24 (×2): 2 [IU] via SUBCUTANEOUS

## 2022-12-23 MED ORDER — ATORVASTATIN CALCIUM 40 MG PO TABS
40.0000 mg | ORAL_TABLET | Freq: Every day | ORAL | Status: DC
  Administered 2022-12-23 – 2022-12-24 (×2): 40 mg via ORAL
  Filled 2022-12-23 (×2): qty 1

## 2022-12-23 MED ORDER — INSULIN ASPART 100 UNIT/ML IJ SOLN
0.0000 [IU] | Freq: Three times a day (TID) | INTRAMUSCULAR | Status: DC
  Administered 2022-12-23: 7 [IU] via SUBCUTANEOUS
  Administered 2022-12-23 – 2022-12-24 (×2): 4 [IU] via SUBCUTANEOUS
  Administered 2022-12-24: 11 [IU] via SUBCUTANEOUS
  Administered 2022-12-24: 15 [IU] via SUBCUTANEOUS
  Administered 2022-12-25: 4 [IU] via SUBCUTANEOUS
  Administered 2022-12-25: 7 [IU] via SUBCUTANEOUS

## 2022-12-23 MED ORDER — METOPROLOL TARTRATE 5 MG/5ML IV SOLN
5.0000 mg | Freq: Once | INTRAVENOUS | Status: DC
  Filled 2022-12-23: qty 5

## 2022-12-23 MED ORDER — LACTATED RINGERS IV BOLUS
500.0000 mL | Freq: Once | INTRAVENOUS | Status: AC
  Administered 2022-12-23: 500 mL via INTRAVENOUS

## 2022-12-23 MED ORDER — LIDOCAINE 5 % EX PTCH
1.0000 | MEDICATED_PATCH | CUTANEOUS | Status: DC
  Administered 2022-12-23 – 2022-12-25 (×3): 1 via TRANSDERMAL
  Filled 2022-12-23 (×3): qty 1

## 2022-12-23 MED ORDER — HYDROXYZINE HCL 25 MG PO TABS
25.0000 mg | ORAL_TABLET | Freq: Every day | ORAL | Status: DC
  Administered 2022-12-23 – 2022-12-24 (×2): 25 mg via ORAL
  Filled 2022-12-23 (×2): qty 1

## 2022-12-23 MED ORDER — TRAZODONE HCL 50 MG PO TABS
150.0000 mg | ORAL_TABLET | Freq: Every day | ORAL | Status: DC
  Administered 2022-12-23 – 2022-12-24 (×2): 150 mg via ORAL
  Filled 2022-12-23 (×2): qty 3

## 2022-12-23 MED ORDER — FAMOTIDINE 20 MG PO TABS
20.0000 mg | ORAL_TABLET | Freq: Every day | ORAL | Status: DC
  Administered 2022-12-23 – 2022-12-25 (×3): 20 mg via ORAL
  Filled 2022-12-23 (×3): qty 1

## 2022-12-23 MED ORDER — PANTOPRAZOLE SODIUM 40 MG PO TBEC
40.0000 mg | DELAYED_RELEASE_TABLET | Freq: Every day | ORAL | Status: DC
  Administered 2022-12-23 – 2022-12-25 (×3): 40 mg via ORAL
  Filled 2022-12-23 (×3): qty 1

## 2022-12-23 MED ORDER — FAMOTIDINE 20 MG PO TABS: 40.0000 mg | ORAL_TABLET | Freq: Every day | ORAL | Status: DC

## 2022-12-23 MED ORDER — GABAPENTIN 300 MG PO CAPS
300.0000 mg | ORAL_CAPSULE | Freq: Three times a day (TID) | ORAL | Status: DC
  Administered 2022-12-23 – 2022-12-25 (×8): 300 mg via ORAL
  Filled 2022-12-23 (×8): qty 1

## 2022-12-23 MED ORDER — MIRTAZAPINE 15 MG PO TABS
15.0000 mg | ORAL_TABLET | Freq: Every day | ORAL | Status: DC
  Administered 2022-12-23 – 2022-12-24 (×2): 15 mg via ORAL
  Filled 2022-12-23 (×2): qty 1

## 2022-12-23 MED ORDER — BACITRACIN ZINC 500 UNIT/GM EX OINT
TOPICAL_OINTMENT | Freq: Every day | CUTANEOUS | Status: DC
  Administered 2022-12-24 – 2022-12-25 (×2): 31.5 via TOPICAL
  Filled 2022-12-23: qty 28.4

## 2022-12-23 MED ORDER — LISINOPRIL 10 MG PO TABS
10.0000 mg | ORAL_TABLET | Freq: Every day | ORAL | Status: DC
  Administered 2022-12-23: 10 mg via ORAL
  Filled 2022-12-23: qty 1

## 2022-12-23 MED ORDER — PRAZOSIN HCL 2 MG PO CAPS
5.0000 mg | ORAL_CAPSULE | Freq: Every day | ORAL | Status: DC
  Administered 2022-12-23 – 2022-12-24 (×2): 5 mg via ORAL
  Filled 2022-12-23 (×3): qty 1

## 2022-12-23 NOTE — Progress Notes (Signed)
Progress Note     Subjective: Patient reports he is feeling sore. Denies SOB. Denies abdominal pain, nausea or vomiting. He lives with his ex-girlfriend, his brother and sister in law and their 2 children. He is not currently working. He has not urinated overnight because urinal was difficult to use.   Objective: Vital signs in last 24 hours: Temp:  [97.5 F (36.4 C)-99.1 F (37.3 C)] 99.1 F (37.3 C) (05/16 0811) Pulse Rate:  [90-113] 93 (05/16 0811) Resp:  [14-22] 14 (05/16 0406) BP: (104-153)/(53-89) 106/66 (05/16 0811) SpO2:  [90 %-98 %] 91 % (05/16 0811) Weight:  [124.7 kg] 124.7 kg (05/16 0047) Last BM Date : 12/22/22  Intake/Output from previous day: 05/15 0701 - 05/16 0700 In: 480 [P.O.:480] Out: -  Intake/Output this shift: No intake/output data recorded.  PE: General: pleasant, WD, obese male who is laying in bed in NAD HEENT: head is normocephalic, atraumatic.  Sclera are noninjected.  PERRL.  Ears and nose without any masses or lesions.  Mouth is pink and moist Heart: regular, rate, and rhythm.  Normal s1,s2. No obvious murmurs, gallops, or rubs noted.  Palpable pedal pulses bilaterally Lungs: CTAB, no wheezes, rhonchi, or rales noted.  Respiratory effort nonlabored. Pulled 1500 on IS Abd: soft, NT, ND, +BS, no masses, hernias, or organomegaly MS: all 4 extremities are symmetrical with no cyanosis, clubbing, or edema. Skin: abrasions to BL shins Neuro: non focal exam, speech clear Psych: A&Ox3 with an appropriate affect.    Lab Results:  Recent Labs    12/22/22 1756 12/22/22 1811 12/23/22 0144  WBC 11.4*  --  13.6*  HGB 14.5 15.3 12.0*  HCT 46.7 45.0 38.1*  PLT 308  --  279   BMET Recent Labs    12/22/22 1756 12/22/22 1811 12/23/22 0144  NA 138 139 136  K 4.4 4.4 4.2  CL 99 99 99  CO2 29  --  26  GLUCOSE 204* 204* 190*  BUN 11 11 9   CREATININE 1.36* 1.30* 1.24  CALCIUM 10.0  --  9.4   PT/INR No results for input(s): "LABPROT", "INR" in  the last 72 hours. CMP     Component Value Date/Time   NA 136 12/23/2022 0144   K 4.2 12/23/2022 0144   CL 99 12/23/2022 0144   CO2 26 12/23/2022 0144   GLUCOSE 190 (H) 12/23/2022 0144   BUN 9 12/23/2022 0144   CREATININE 1.24 12/23/2022 0144   CALCIUM 9.4 12/23/2022 0144   PROT 7.6 12/22/2022 1756   ALBUMIN 4.1 12/22/2022 1756   AST 253 (H) 12/22/2022 1756   ALT 199 (H) 12/22/2022 1756   ALKPHOS 94 12/22/2022 1756   BILITOT 0.8 12/22/2022 1756   GFRNONAA >60 12/23/2022 0144   Lipase  No results found for: "LIPASE"     Studies/Results: CT CHEST ABDOMEN PELVIS W CONTRAST  Result Date: 12/22/2022 CLINICAL DATA:  Polytrauma, blunt EXAM: CT CHEST, ABDOMEN, AND PELVIS WITH CONTRAST TECHNIQUE: Multidetector CT imaging of the chest, abdomen and pelvis was performed following the standard protocol during bolus administration of intravenous contrast. RADIATION DOSE REDUCTION: This exam was performed according to the departmental dose-optimization program which includes automated exposure control, adjustment of the mA and/or kV according to patient size and/or use of iterative reconstruction technique. CONTRAST:  75mL OMNIPAQUE IOHEXOL 350 MG/ML SOLN COMPARISON:  CT abdomen pelvis 11/01/2020 FINDINGS: CHEST: Cardiovascular: No aortic injury. The thoracic aorta is normal in caliber. The heart is normal in size. No significant pericardial effusion.  Mediastinum/Nodes: No pneumomediastinum. No mediastinal hematoma. The esophagus is unremarkable. The thyroid is unremarkable. The central airways are patent. No mediastinal, hilar, or axillary lymphadenopathy. Lungs/Pleura: No focal consolidation. No pulmonary nodule. No pulmonary mass. No pulmonary contusion or laceration. No pneumatocele formation. No pleural effusion. No pneumothorax. No hemothorax. Musculoskeletal/Chest wall: No chest wall mass. Acute nondisplaced fracture of the right anterolateral second rib. Acute half shaft width displaced  posterior right fifth rib fracture. No acute sternal fracture. No spinal fracture. ABDOMEN / PELVIS: Hepatobiliary: Not enlarged. No focal lesion. No laceration or subcapsular hematoma. Status post cholecystectomy.  No biliary ductal dilatation. Pancreas: Normal pancreatic contour. No main pancreatic duct dilatation. Spleen: Pain spleen is enlarged measuring up to 14 cm. No focal lesion. No laceration, subcapsular hematoma, or vascular injury. Adrenals/Urinary Tract: Fat stranding of the left adrenal gland nodule. No nodularity bilaterally. bilateral kidneys enhance symmetrically. No hydronephrosis. No contusion, laceration, or subcapsular hematoma. Subcentimeter hypodensities are too small to characterize. No injury to the vascular structures or collecting systems. No hydroureter. The urinary bladder is unremarkable. Stomach/Bowel: No small or large bowel wall thickening or dilatation. Colonic diverticulosis. The appendix is unremarkable. Vasculature/Lymphatics: Atherosclerotic plaque. No abdominal aorta or iliac aneurysm. No active contrast extravasation or pseudoaneurysm. No abdominal, pelvic, inguinal lymphadenopathy. Reproductive: Prostate is unremarkable. Other: No simple free fluid ascites. No pneumoperitoneum. No hemoperitoneum. No mesenteric hematoma identified. No organized fluid collection. Musculoskeletal: No significant soft tissue hematoma. Acute minimally displaced right L1 through L5 transverse process fractures. Chronic bilateral L5 pars interarticularis defects. No spinal fracture. Ports and Devices: None. IMPRESSION: 1. Acute nondisplaced fracture of the right anterolateral second rib. Acute half shaft width displaced posterior right fifth rib fracture. No associated pneumothorax. 2. Fat stranding of the left adrenal gland nodule suggestive of adrenal injury/hematoma. No active hemorrhage identified. 3. No acute intrathoracic or intrapelvic traumatic injury. 4. No acute fracture or traumatic  malalignment of the thoracic spine. 5. Acute minimally displaced right L1 through L5 transverse process fractures. 6.  Aortic Atherosclerosis (ICD10-I70.0). Electronically Signed   By: Tish Frederickson M.D.   On: 12/22/2022 20:02   CT HEAD WO CONTRAST  Result Date: 12/22/2022 CLINICAL DATA:  Head trauma, moderate-severe; Polytrauma, blunt EXAM: CT HEAD WITHOUT CONTRAST CT CERVICAL SPINE WITHOUT CONTRAST TECHNIQUE: Multidetector CT imaging of the head and cervical spine was performed following the standard protocol without intravenous contrast. Multiplanar CT image reconstructions of the cervical spine were also generated. RADIATION DOSE REDUCTION: This exam was performed according to the departmental dose-optimization program which includes automated exposure control, adjustment of the mA and/or kV according to patient size and/or use of iterative reconstruction technique. COMPARISON:  None Available. FINDINGS: CT HEAD FINDINGS Brain: No evidence of large-territorial acute infarction. No parenchymal hemorrhage. No mass lesion. No extra-axial collection. No mass effect or midline shift. No hydrocephalus. Basilar cisterns are patent. Vascular: No hyperdense vessel. Skull: No acute fracture or focal lesion. Sinuses/Orbits: Left maxillary sinus mucosal thickening. Otherwise paranasal sinuses and mastoid air cells are clear. The orbits are unremarkable. Other: None. CT CERVICAL SPINE FINDINGS Alignment: Normal. Skull base and vertebrae: No acute fracture. No aggressive appearing focal osseous lesion or focal pathologic process. Soft tissues and spinal canal: No prevertebral fluid or swelling. No visible canal hematoma. Upper chest: Unremarkable. Other: Acute nondisplaced fracture of the right anterolateral second rib. IMPRESSION: 1. No acute intracranial abnormality. 2. No acute displaced fracture or traumatic listhesis of the cervical spine. 3. Acute nondisplaced fracture of the right anterolateral second rib.  Electronically  Signed   By: Tish Frederickson M.D.   On: 12/22/2022 19:47   CT CERVICAL SPINE WO CONTRAST  Result Date: 12/22/2022 CLINICAL DATA:  Head trauma, moderate-severe; Polytrauma, blunt EXAM: CT HEAD WITHOUT CONTRAST CT CERVICAL SPINE WITHOUT CONTRAST TECHNIQUE: Multidetector CT imaging of the head and cervical spine was performed following the standard protocol without intravenous contrast. Multiplanar CT image reconstructions of the cervical spine were also generated. RADIATION DOSE REDUCTION: This exam was performed according to the departmental dose-optimization program which includes automated exposure control, adjustment of the mA and/or kV according to patient size and/or use of iterative reconstruction technique. COMPARISON:  None Available. FINDINGS: CT HEAD FINDINGS Brain: No evidence of large-territorial acute infarction. No parenchymal hemorrhage. No mass lesion. No extra-axial collection. No mass effect or midline shift. No hydrocephalus. Basilar cisterns are patent. Vascular: No hyperdense vessel. Skull: No acute fracture or focal lesion. Sinuses/Orbits: Left maxillary sinus mucosal thickening. Otherwise paranasal sinuses and mastoid air cells are clear. The orbits are unremarkable. Other: None. CT CERVICAL SPINE FINDINGS Alignment: Normal. Skull base and vertebrae: No acute fracture. No aggressive appearing focal osseous lesion or focal pathologic process. Soft tissues and spinal canal: No prevertebral fluid or swelling. No visible canal hematoma. Upper chest: Unremarkable. Other: Acute nondisplaced fracture of the right anterolateral second rib. IMPRESSION: 1. No acute intracranial abnormality. 2. No acute displaced fracture or traumatic listhesis of the cervical spine. 3. Acute nondisplaced fracture of the right anterolateral second rib. Electronically Signed   By: Tish Frederickson M.D.   On: 12/22/2022 19:47   DG Chest Port 1 View  Result Date: 12/22/2022 CLINICAL DATA:  Fall onto  back EXAM: PORTABLE CHEST 1 VIEW COMPARISON:  Chest radiograph dated 08/18/2022 FINDINGS: Normal lung volumes. Bibasilar patchy opacities. No pleural effusion or pneumothorax. The heart size and mediastinal contours are within normal limits. Minimally displaced right posterior fifth rib fracture. IMPRESSION: 1. Minimally displaced right posterior fifth rib fracture. 2. Bibasilar patchy opacities, likely atelectasis. Electronically Signed   By: Agustin Cree M.D.   On: 12/22/2022 19:13   DG Pelvis Portable  Result Date: 12/22/2022 CLINICAL DATA:  Fall EXAM: PORTABLE PELVIS 1-2 VIEWS COMPARISON:  None Available. FINDINGS: There is no evidence of pelvic fracture or diastasis. No pelvic bone lesions are seen. IMPRESSION: Negative. Electronically Signed   By: Charlett Nose M.D.   On: 12/22/2022 19:10   NM GASTRIC EMPTYING  Result Date: 12/21/2022 CLINICAL DATA:  Gastroparesis suspected. EXAM: NUCLEAR MEDICINE GASTRIC EMPTYING SCAN TECHNIQUE: After oral ingestion of radiolabeled meal, sequential abdominal images were obtained for 4 hours. Percentage of activity emptying the stomach was calculated at 1 hour, 2 hour, 3 hour, and 4 hours. RADIOPHARMACEUTICALS:  2.1 mCi Tc-24m sulfur colloid in standardized meal COMPARISON:  Gastric emptying study December 03, 2021 FINDINGS: Expected location of the stomach in the left upper quadrant. Ingested meal empties the stomach slowly and incompletely over the course of the study. 1% emptied at 1 hr ( normal >= 10%) 7% emptied at 2 hr ( normal >= 40%) 23% emptied at 3 hr ( normal >= 70%) 48% emptied at 4 hr ( normal >= 90%) IMPRESSION: Scintigraphic findings compatible with delayed gastric emptying. Electronically Signed   By: Maudry Mayhew M.D.   On: 12/21/2022 14:35    Anti-infectives: Anti-infectives (From admission, onward)    None        Assessment/Plan  Fall off roof  Right 2,5 rib fractures - multimodal pain control, Pulm toilet, IS R lumbar 1-5  TVP fractures -  pain control, PT/OT Left adrenal contusion - hgb 12 from 14.5, continue to monitor  Abrasions to BLE - ok to shower/cleanse wounds with soap and water, local wound care  FEN: reg diet VTE: LMWH ID: no current abx  Dispo: pain control, PT/OT. Possible discharge later today pending therapy evals   LOS: 0 days   I reviewed ED provider notes, last 24 h vitals and pain scores, last 48 h intake and output, last 24 h labs and trends, and last 24 h imaging results.   Juliet Rude, [facility] 12/23/2022, 8:37 AM Please see Amion for pager number during day hours 7:00am-4:30pm

## 2022-12-23 NOTE — Progress Notes (Signed)
Physical Therapy Evaluation Patient Details Name: Leon Ross MRN: [id number] DOB: December 01, 1970 Today's Date: 12/23/2022  History of Present Illness  52 yo male sustained a fall from a roof on 5/15, admitted for evaluation.  Has fractures of R 2nd and 5th ribs, of TP fractures on lumbar 1-5 on R.  Difficulty using RLE to lift up.  Has landed on spine and hit his head, was unable to respond initially to his family members.  PMHx:  Anxiety, schizoaffective disorder, sleep apnea, HLD, HTN, gastroparesis, DM, asthma, depression,  Clinical Impression  Pt was seen for mobility after being up to chair with help. Pt is feeling fine, noted elevation of HR to 109 with resting on chair, up to 124 with gait and returned to 107 at rest.  Pt initially used a RW but set aside as he did not really need it. Without a walker was safe and controlled to walk, but upon sitting found to have BP 86/56 (66) and then after a rest 80/44 (54).  Pt is controlled for sats, but the other non symptomatic issues are a concern.  Reported to MD to manage and will follow up with pt tomorrow to see how he is recovering his vitals and if he is feeling the changes.  Follow acute PT goals as outlined below.      Recommendations for follow up therapy are one component of a multi-disciplinary discharge planning process, led by the attending physician.  Recommendations may be updated based on patient status, additional functional criteria and insurance authorization.  Follow Up Recommendations       Assistance Recommended at Discharge Intermittent Supervision/Assistance  Patient can return home with the following  A little help with walking and/or transfers;A little help with bathing/dressing/bathroom;Assistance with cooking/housework;Assist for transportation;Help with stairs or ramp for entrance    Equipment Recommendations None recommended by PT  Recommendations for Other Services       Functional Status Assessment        Precautions / Restrictions Precautions Precautions: Other (comment) Precaution Comments: watch BP Restrictions Weight Bearing Restrictions: No      Mobility  Bed Mobility Overal bed mobility: Needs Assistance Bed Mobility: Supine to Sit, Sit to Supine     Supine to sit: Min guard Sit to supine: Min guard   General bed mobility comments: pt has a hospital bed at home so used features to manage back moving toward and off L side    Transfers Overall transfer level: Needs assistance Equipment used: None Transfers: Sit to/from Stand Sit to Stand: Min guard           General transfer comment: min guard for safety    Ambulation/Gait Ambulation/Gait assistance: Min guard (for safety) Gait Distance (Feet): 50 Feet Assistive device: None Gait Pattern/deviations: Step-through pattern, Decreased stride length, Wide base of support Gait velocity: reduced, controlled Gait velocity interpretation: <1.31 ft/sec, indicative of household ambulator Pre-gait activities: standing balance ck General Gait Details: Pt was seen for progression of steps and balance without AD, but noted post walk pt was hypotensive, not symptomatic  Stairs            Wheelchair Mobility    Modified Rankin (Stroke Patients Only)       Balance Overall balance assessment: Needs assistance Sitting-balance support: Feet supported Sitting balance-Leahy Scale: Good     Standing balance support: No upper extremity supported, Bilateral upper extremity supported Standing balance-Leahy Scale: Good Standing balance comment: dynamic standing fair  Pertinent Vitals/Pain Pain Assessment Pain Assessment: Faces Faces Pain Scale: Hurts little more Pain Location: R back pain under scapula, lumbar spine, B shins Pain Descriptors / Indicators: Guarding, Grimacing, Aching Pain Intervention(s): Limited activity within patient's tolerance, Monitored during session,  Premedicated before session, Repositioned    Home Living Family/patient expects to be discharged to:: Private residence Living Arrangements: Spouse/significant other;Other relatives Available Help at Discharge: Family;Friend(s) Type of Home: Mobile home Home Access: Ramped entrance       Home Layout: One level Home Equipment: Rolling Walker (2 wheels);Cane - single point;Shower seat;Wheelchair - manual Additional Comments: Pt lives at home with ex GF, brother and his wife and their kids.    Prior Function Prior Level of Function : Independent/Modified Independent             Mobility Comments: no AD, was working       Hand Dominance   Dominant Hand: Right    Extremity/Trunk Assessment   Upper Extremity Assessment Upper Extremity Assessment: Defer to OT evaluation    Lower Extremity Assessment Lower Extremity Assessment: Overall WFL for tasks assessed    Cervical / Trunk Assessment Cervical / Trunk Assessment: Other exceptions (new fractures R ribs and lumbar TP's) Cervical / Trunk Exceptions: new fractures from fall off roof  Communication   Communication: No difficulties  Cognition Arousal/Alertness: Awake/alert Behavior During Therapy: WFL for tasks assessed/performed Overall Cognitive Status: Within Functional Limits for tasks assessed                                 General Comments: pt did hit his head upon falling        General Comments General comments (skin integrity, edema, etc.): Pt was seen for mobility initially with walker and set aside as he did not really require it    Exercises     Assessment/Plan    PT Assessment    PT Problem List         PT Treatment Interventions      PT Goals (Current goals can be found in the Care Plan section)  Acute Rehab PT Goals Patient Stated Goal: to walk and go home PT Goal Formulation: With patient Time For Goal Achievement: 01/06/23 Potential to Achieve Goals: Good    Frequency  Min 3X/week     Co-evaluation               AM-PAC PT "6 Clicks" Mobility  Outcome Measure Help needed turning from your back to your side while in a flat bed without using bedrails?: A Little Help needed moving from lying on your back to sitting on the side of a flat bed without using bedrails?: A Little Help needed moving to and from a bed to a chair (including a wheelchair)?: A Little Help needed standing up from a chair using your arms (e.g., wheelchair or bedside chair)?: A Little Help needed to walk in hospital room?: A Little Help needed climbing 3-5 steps with a railing? : A Little 6 Click Score: 18    End of Session Equipment Utilized During Treatment: Gait belt Activity Tolerance: Patient tolerated treatment well;Patient limited by pain Patient left: in chair;with call bell/phone within reach Nurse Communication: Mobility status PT Visit Diagnosis: Unsteadiness on feet (R26.81);Other abnormalities of gait and mobility (R26.89);Pain Pain - Right/Left: Right Pain - part of body:  (back, chest)    Time: 1610-9604 PT Time Calculation (min) (ACUTE ONLY): 38 min  Charges:   PT Evaluation $PT Eval Moderate Complexity: 1 Mod PT Treatments $Gait Training: 8-22 mins $Therapeutic Activity: 8-22 mins       [name] 12/23/2022, 4:40 PM  Samul Dada, PT PhD Acute Rehab Dept. Number: [facility] [id number] and [facility] [phone]

## 2022-12-23 NOTE — Progress Notes (Signed)
PT Cancellation Note  Patient Details Name: Leon Ross MRN: [id number] DOB: 1971/03/05   Cancelled Treatment:    Reason Eval/Treat Not Completed: Patient declined, no reason specified.  Refused based on having his meal in bed and just saw OT, retry another time.   [name] 12/23/2022, 11:15 AM  Samul Dada, PT PhD Acute Rehab Dept. Number: [facility] [id number] and [facility] [phone]

## 2022-12-23 NOTE — Progress Notes (Signed)
Consult received for medication assistance. Pt with Medicaid. Medicaid should cover meds. Pt cost, ? $3.00-4.00 per RX. Gae Gallop RN,BSN,CM [phone]

## 2022-12-23 NOTE — Evaluation (Signed)
Occupational Therapy Evaluation Patient Details Name: Leon Ross MRN: [id number] DOB: 30-Jan-1971 Today's Date: 12/23/2022   History of Present Illness Pt reports that he was standing approximately 8-10 feet above the ground trying to put a tarp on a roof when he fell onto the concrete, landing on his back. Pt denies LOC. Pt c/o posterior head pain, bilateral shins, and the R side of his thoracic spine.   Clinical Impression   Pt s/p above diagnosis. Pt independent prior to accident. Pt currently requires min A for dressing, instructed on use of sock aide and reacher for LB dressing. Pt mod A for supine to sit on EOB, HH assistance and HOB elevated, Pt does have hospital bed at home and assistance 24/7 to get in/out of bed if needed. Pt lives at home with ex gf, brother and his wife and two kids, in mobile home, ramped entrance. Pt feels confident if he were to return home he has DME required to be mod I, shower chair, RW, canes, w/c's, and his family is available at home 24/7 to help with dressing, getting in/out of bed. Pt will be seen acutely to improve functional independence and mobility as necessary, Pt decline follow up therapy, feels confident with current assistance at home.      Recommendations for follow up therapy are one component of a multi-disciplinary discharge planning process, led by the attending physician.  Recommendations may be updated based on patient status, additional functional criteria and insurance authorization.   Assistance Recommended at Discharge Intermittent Supervision/Assistance  Patient can return home with the following A little help with walking and/or transfers;A little help with bathing/dressing/bathroom;Assistance with cooking/housework;Assist for transportation;Help with stairs or ramp for entrance    Functional Status Assessment  Patient has had a recent decline in their functional status and demonstrates the ability to make significant  improvements in function in a reasonable and predictable amount of time.  Equipment Recommendations  None recommended by OT    Recommendations for Other Services       Precautions / Restrictions Precautions Precautions: None Restrictions Weight Bearing Restrictions: No      Mobility Bed Mobility Overal bed mobility: Needs Assistance Bed Mobility: Supine to Sit, Sit to Supine     Supine to sit: Mod assist, HOB elevated Sit to supine: Modified independent (Device/Increase time) (using leg lifter)   General bed mobility comments: HH assistance sitting up due to back pain sup to sit. Pt requires BLE assistance with sit to supine, able to perform with supervision using leg lifter.    Transfers Overall transfer level: Modified independent Equipment used: None               General transfer comment: Pt able to perform sit to stand and ambulation, toilet transfer with mod I, no LOB, increased time due to soreness/pain      Balance Overall balance assessment: Needs assistance Sitting-balance support: No upper extremity supported, Feet supported Sitting balance-Leahy Scale: Good Sitting balance - Comments: able to sit at EOB, no LOB, difficulty with reaching down due to back pain   Standing balance support: No upper extremity supported Standing balance-Leahy Scale: Good Standing balance comment: some stiffness due to back pain with standing/ambulation, no LOB                           ADL either performed or assessed with clinical judgement   ADL Overall ADL's : Needs assistance/impaired Eating/Feeding: Independent   Grooming: Supervision/safety;Standing  Grooming Details (indicate cue type and reason): increased time due to back pain with standing. Upper Body Bathing: Minimal assistance Upper Body Bathing Details (indicate cue type and reason): q Lower Body Bathing: Minimal assistance;With adaptive equipment   Upper Body Dressing : Minimal assistance    Lower Body Dressing: Minimal assistance;With adaptive equipment   Toilet Transfer: Modified Independent   Toileting- Clothing Manipulation and Hygiene: Modified independent       Functional mobility during ADLs: Supervision/safety General ADL Comments: Pt requires assistance with LB Dressing due to low back pain, instructed on use of sock aide and reacher, able to complete min A.     Vision Baseline Vision/History: 0 No visual deficits Ability to See in Adequate Light: 0 Adequate Patient Visual Report: No change from baseline       Perception     Praxis      Pertinent Vitals/Pain Pain Assessment Pain Assessment: 0-10 Pain Score: 7  Pain Location: R back pain under scapula, lumbar spine, B shins Pain Descriptors / Indicators: Aching, Discomfort Pain Intervention(s): Monitored during session     Hand Dominance     Extremity/Trunk Assessment Upper Extremity Assessment Upper Extremity Assessment: Generalized weakness;RUE deficits/detail RUE Deficits / Details: pain with AROM due to pain at back ribs RUE: Unable to fully assess due to pain RUE Sensation: WNL RUE Coordination: decreased gross motor   Lower Extremity Assessment Lower Extremity Assessment: Defer to PT evaluation       Communication Communication Communication: No difficulties   Cognition Arousal/Alertness: Awake/alert Behavior During Therapy: WFL for tasks assessed/performed Overall Cognitive Status: Within Functional Limits for tasks assessed                                       General Comments       Exercises     Shoulder Instructions      Home Living Family/patient expects to be discharged to:: Private residence Living Arrangements: Spouse/significant other;Other relatives Available Help at Discharge: Family;Friend(s) Type of Home: Mobile home Home Access: Ramped entrance     Home Layout: One level     Bathroom Shower/Tub: Producer, television/film/video:  Standard     Home Equipment: Agricultural consultant (2 wheels);Cane - single point;Shower seat;Wheelchair - manual   Additional Comments: Pt lives at home with ex GF, brother and his wife and their kids.      Prior Functioning/Environment Prior Level of Function : Independent/Modified Independent             Mobility Comments: ind ADLs Comments: ind        OT Problem List: Decreased range of motion;Decreased activity tolerance;Impaired UE functional use;Pain      OT Treatment/Interventions: Self-care/ADL training;Therapeutic exercise;Energy conservation;Therapeutic activities;Manual therapy    OT Goals(Current goals can be found in the care plan section) Acute Rehab OT Goals Patient Stated Goal: to decrease pain and return home OT Goal Formulation: With patient Time For Goal Achievement: 01/06/23 Potential to Achieve Goals: Good  OT Frequency: Min 2X/week    Co-evaluation              AM-PAC OT "6 Clicks" Daily Activity     Outcome Measure Help from another person eating meals?: None Help from another person taking care of personal grooming?: A Little Help from another person toileting, which includes using toliet, bedpan, or urinal?: None Help from another person bathing (including washing, rinsing,  drying)?: A Little Help from another person to put on and taking off regular upper body clothing?: A Little Help from another person to put on and taking off regular lower body clothing?: A Little 6 Click Score: 20   End of Session Equipment Utilized During Treatment: Gait belt;Rolling walker (2 wheels) Nurse Communication: Mobility status  Activity Tolerance: Patient limited by pain Patient left: in bed;with call bell/phone within reach  OT Visit Diagnosis: Other abnormalities of gait and mobility (R26.89);Muscle weakness (generalized) (M62.81);Pain                Time: 0950-1030 OT Time Calculation (min): 40 min Charges:  OT General Charges $OT Visit: 1 Visit OT  Evaluation $OT Eval Moderate Complexity: 1 Mod OT Treatments $Self Care/Home Management : 23-37 mins  [address], OTR/L   Alexis Goodell 12/23/2022, 11:11 AM

## 2022-12-24 ENCOUNTER — Observation Stay (HOSPITAL_COMMUNITY): Payer: Medicaid Other

## 2022-12-24 DIAGNOSIS — Z1152 Encounter for screening for COVID-19: Secondary | ICD-10-CM | POA: Diagnosis not present

## 2022-12-24 DIAGNOSIS — S32059A Unspecified fracture of fifth lumbar vertebra, initial encounter for closed fracture: Secondary | ICD-10-CM | POA: Diagnosis present

## 2022-12-24 DIAGNOSIS — S37812A Contusion of adrenal gland, initial encounter: Secondary | ICD-10-CM | POA: Diagnosis present

## 2022-12-24 DIAGNOSIS — S20221A Contusion of right back wall of thorax, initial encounter: Secondary | ICD-10-CM | POA: Diagnosis present

## 2022-12-24 DIAGNOSIS — F259 Schizoaffective disorder, unspecified: Secondary | ICD-10-CM | POA: Diagnosis present

## 2022-12-24 DIAGNOSIS — S32049A Unspecified fracture of fourth lumbar vertebra, initial encounter for closed fracture: Secondary | ICD-10-CM | POA: Diagnosis present

## 2022-12-24 DIAGNOSIS — E669 Obesity, unspecified: Secondary | ICD-10-CM | POA: Diagnosis present

## 2022-12-24 DIAGNOSIS — E785 Hyperlipidemia, unspecified: Secondary | ICD-10-CM | POA: Diagnosis present

## 2022-12-24 DIAGNOSIS — S32039A Unspecified fracture of third lumbar vertebra, initial encounter for closed fracture: Secondary | ICD-10-CM | POA: Diagnosis present

## 2022-12-24 DIAGNOSIS — Y9389 Activity, other specified: Secondary | ICD-10-CM | POA: Diagnosis not present

## 2022-12-24 DIAGNOSIS — S2249XA Multiple fractures of ribs, unspecified side, initial encounter for closed fracture: Secondary | ICD-10-CM | POA: Diagnosis present

## 2022-12-24 DIAGNOSIS — Z6841 Body Mass Index (BMI) 40.0 and over, adult: Secondary | ICD-10-CM | POA: Diagnosis not present

## 2022-12-24 DIAGNOSIS — S32019A Unspecified fracture of first lumbar vertebra, initial encounter for closed fracture: Secondary | ICD-10-CM | POA: Diagnosis present

## 2022-12-24 DIAGNOSIS — S80211A Abrasion, right knee, initial encounter: Secondary | ICD-10-CM | POA: Diagnosis present

## 2022-12-24 DIAGNOSIS — Z23 Encounter for immunization: Secondary | ICD-10-CM | POA: Diagnosis not present

## 2022-12-24 DIAGNOSIS — S80212A Abrasion, left knee, initial encounter: Secondary | ICD-10-CM | POA: Diagnosis present

## 2022-12-24 DIAGNOSIS — F32A Depression, unspecified: Secondary | ICD-10-CM | POA: Diagnosis present

## 2022-12-24 DIAGNOSIS — Y92018 Other place in single-family (private) house as the place of occurrence of the external cause: Secondary | ICD-10-CM | POA: Diagnosis not present

## 2022-12-24 DIAGNOSIS — E1143 Type 2 diabetes mellitus with diabetic autonomic (poly)neuropathy: Secondary | ICD-10-CM | POA: Diagnosis present

## 2022-12-24 DIAGNOSIS — S2241XA Multiple fractures of ribs, right side, initial encounter for closed fracture: Secondary | ICD-10-CM | POA: Diagnosis present

## 2022-12-24 DIAGNOSIS — S0093XA Contusion of unspecified part of head, initial encounter: Secondary | ICD-10-CM | POA: Diagnosis present

## 2022-12-24 DIAGNOSIS — Z87891 Personal history of nicotine dependence: Secondary | ICD-10-CM | POA: Diagnosis not present

## 2022-12-24 DIAGNOSIS — W132XXA Fall from, out of or through roof, initial encounter: Secondary | ICD-10-CM | POA: Diagnosis present

## 2022-12-24 DIAGNOSIS — S20211A Contusion of right front wall of thorax, initial encounter: Secondary | ICD-10-CM | POA: Diagnosis present

## 2022-12-24 DIAGNOSIS — S32029A Unspecified fracture of second lumbar vertebra, initial encounter for closed fracture: Secondary | ICD-10-CM | POA: Diagnosis present

## 2022-12-24 DIAGNOSIS — Z794 Long term (current) use of insulin: Secondary | ICD-10-CM | POA: Diagnosis not present

## 2022-12-24 DIAGNOSIS — I10 Essential (primary) hypertension: Secondary | ICD-10-CM | POA: Diagnosis present

## 2022-12-24 DIAGNOSIS — K3184 Gastroparesis: Secondary | ICD-10-CM | POA: Diagnosis present

## 2022-12-24 LAB — RESPIRATORY PANEL BY PCR

## 2022-12-24 LAB — BASIC METABOLIC PANEL
Anion gap: 9 (ref 5–15)
BUN: 12 mg/dL (ref 6–20)
CO2: 27 mmol/L (ref 22–32)
Calcium: 8.8 mg/dL — ABNORMAL LOW (ref 8.9–10.3)
Chloride: 98 mmol/L (ref 98–111)
Creatinine, Ser: 1.29 mg/dL — ABNORMAL HIGH (ref 0.61–1.24)
GFR, Estimated: >60 mL/min (ref >60)
Glucose, Bld: 210 mg/dL — ABNORMAL HIGH (ref 70–99)
Potassium: 4.4 mmol/L (ref 3.5–5.1)
Sodium: 134 mmol/L — ABNORMAL LOW (ref 135–145)

## 2022-12-24 LAB — GLUCOSE, CAPILLARY
Glucose-Capillary: 200 mg/dL — ABNORMAL HIGH (ref 70–99)
Glucose-Capillary: 338 mg/dL — ABNORMAL HIGH (ref 70–99)
Glucose-Capillary: 256 mg/dL — ABNORMAL HIGH (ref 70–99)
Glucose-Capillary: 205 mg/dL — ABNORMAL HIGH (ref 70–99)

## 2022-12-24 LAB — CBC
HCT: 40.8 % (ref 39.0–52.0)
Hemoglobin: 12.8 g/dL — ABNORMAL LOW (ref 13.0–17.0)
MCH: 24.7 pg — ABNORMAL LOW (ref 26.0–34.0)
MCHC: 31.4 g/dL (ref 30.0–36.0)
MCV: 78.8 fL — ABNORMAL LOW (ref 80.0–100.0)
Platelets: 283 10*3/uL (ref 150–400)
RBC: 5.18 MIL/uL (ref 4.22–5.81)
RDW: 16.6 % — ABNORMAL HIGH (ref 11.5–15.5)
WBC: 11.2 10*3/uL — ABNORMAL HIGH (ref 4.0–10.5)
nRBC: 0 % (ref 0.0–0.2)

## 2022-12-24 LAB — MISC LABCORP TEST (SEND OUT): Labcorp test code: 83935

## 2022-12-24 LAB — SARS CORONAVIRUS 2 BY RT PCR: SARS Coronavirus 2 by RT PCR: NEGATIVE

## 2022-12-24 MED ORDER — SODIUM CHLORIDE 0.9 % IV SOLN: INTRAVENOUS | Status: DC

## 2022-12-24 MED ORDER — GUAIFENESIN ER 600 MG PO TB12
600.0000 mg | ORAL_TABLET | Freq: Two times a day (BID) | ORAL | Status: DC
  Administered 2022-12-24 – 2022-12-25 (×3): 600 mg via ORAL
  Filled 2022-12-24 (×3): qty 1

## 2022-12-24 MED ORDER — CALCIUM POLYCARBOPHIL 625 MG PO TABS: 625.0000 mg | ORAL_TABLET | Freq: Two times a day (BID) | ORAL | Status: DC

## 2022-12-24 MED ORDER — INSULIN GLARGINE-YFGN 100 UNIT/ML ~~LOC~~ SOLN
20.0000 [IU] | Freq: Every day | SUBCUTANEOUS | Status: DC
  Administered 2022-12-24: 20 [IU] via SUBCUTANEOUS
  Filled 2022-12-24 (×3): qty 0.2

## 2022-12-24 MED ORDER — INSULIN ASPART 100 UNIT/ML IJ SOLN
4.0000 [IU] | Freq: Three times a day (TID) | INTRAMUSCULAR | Status: DC
  Administered 2022-12-24 – 2022-12-25 (×2): 4 [IU] via SUBCUTANEOUS

## 2022-12-24 MED ORDER — LACTATED RINGERS IV BOLUS
500.0000 mL | Freq: Once | INTRAVENOUS | Status: AC
  Administered 2022-12-24: 500 mL via INTRAVENOUS

## 2022-12-24 NOTE — TOC Initial Note (Signed)
Transition of Care [facility] 2) - Initial/Assessment Note    Patient Details  Name: Leon Ross MRN: [id number] Date of Birth: [birth year]  Transition of [facility]) CM/SW Contact:    Epifanio Lesches, RN Phone Number: 12/24/2022, 1:10 PM  Clinical Narrative:                   s/p fall from ladder, rib fxs/ lumbar fxs From home with brother and sister in law. PTA independent with ADL's, no DME usage.  Shared PT's recommendation for home health PT, pt declined. Pt without present DME needs. States doesn't need. Pt without RX med concerns, pt with Medicaid.  Post hospital f/u noted on AVS. Hoping to have transportation to home once d/c ready.  TOC team will continue to monitor nd assist with needs...  Expected Discharge Plan: Home/Self Care (declined home health services) Barriers to Discharge: Continued Medical Work up   Patient Goals and CMS Choice            Expected Discharge Plan and Services   Discharge Planning Services: CM Consult   Living arrangements for the past 2 months: Single Family Home                                      Prior Living Arrangements/Services Living arrangements for the past 2 months: Single Family Home Lives with:: Siblings (brother and sister in sister) Patient language and need for interpreter reviewed:: Yes Do you feel safe going back to the place where you live?: Yes      Need for Family Participation in Patient Care: Yes (Comment) Care giver support system in place?: No (comment)      Activities of Daily Living Home Assistive Devices/Equipment: None ADL Screening (condition at time of admission) Patient's cognitive ability adequate to safely complete daily activities?: Yes Is the patient deaf or have difficulty hearing?: No Does the patient have difficulty seeing, even when wearing glasses/contacts?: No Does the patient have difficulty concentrating, remembering, or making decisions?: No Patient able to express need  for assistance with ADLs?: Yes Does the patient have difficulty dressing or bathing?: No Independently performs ADLs?: Yes (appropriate for developmental age) Does the patient have difficulty walking or climbing stairs?: No Weakness of Legs: None Weakness of Arms/Hands: None  Permission Sought/Granted                  Emotional Assessment Appearance:: Appears stated age     Orientation: : Oriented to Self, Oriented to Place, Oriented to  Time, Oriented to Situation Alcohol / Substance Use: Not Applicable Psych Involvement: No (comment)  Admission diagnosis:  Contusion of adrenal gland, initial encounter [S37.812A] Contusion of head, initial encounter [S00.93XA] Fall from roof, initial encounter [W13.2XXA] Contusion, back, right, initial encounter [S20.221A] Abrasion, multiple sites [T07.XXXA] Closed fracture of transverse process of lumbar vertebra, initial encounter (HCC) [S32.009A] Closed fracture of multiple ribs of right side, initial encounter [S22.41XA] Multiple fractures of ribs, right side, init for clos fx [S22.41XA] Rib fractures [S22.49XA] Patient Active Problem List   Diagnosis Date Noted   Rib fractures 12/24/2022   Multiple fractures of ribs, right side, init for clos fx 12/22/2022   Type 2 diabetes mellitus with obesity (HCC) 05/23/2021   Dyslipidemia 05/23/2021   History of posttraumatic stress disorder (PTSD) 05/23/2021   GERD (gastroesophageal reflux disease) 05/23/2021   Schizoaffective disorder, bipolar type (HCC) 05/22/2021   PCP:  Irving Burton,  Orie Rout, NP Pharmacy:   [facility] [address], [place] - [address] [facility] [place] [address] Phone: [phone] Fax: [phone]  [facility] [place], [place] - [address], [place] [address] [place] [address] [place] [address] Phone: [phone] Fax: [phone]     Social Determinants of Health (SDOH) Social History: SDOH Screenings    Food Insecurity: Food Insecurity Present (12/23/2022)  Housing: High Risk (12/23/2022)  Transportation Needs: Unmet Transportation Needs (12/23/2022)  Utilities: At Risk (12/23/2022)  Alcohol Screen: Low Risk  (05/22/2021)  Tobacco Use: Medium Risk (12/13/2022)   SDOH Interventions:     Readmission Risk Interventions     No data to display

## 2022-12-24 NOTE — Progress Notes (Signed)
CSW met with pt regarding SHOD: food, transportation, housing. Pt reports that he is currently staying with a family member and does have housing.  [place] address, but he is in [place].  He is trying not to "overstay my welcome."  Pt indicates he does not have any income at this time, would like to find his own housing, aware that will need regular income in order to secure this.  No immediate need here.   Pt reports he does receive $281 per month in food stamps.  States that this does run out before the end of the month often.  He is aware of and using one food pantry at a church in [place].  CSW provided list of other food pantry options in [facility].   Transportation-pt does have a car, but reports that is is unreliable.  Pt does have medicaid, discussed medicaid transportation for medical appts.  Contact information for [employer] transportation in [facility] provided. Pt verbalizes understanding of these resources. Daleen Squibb, MSW, LCSW 5/17/20241:59 PM

## 2022-12-24 NOTE — Progress Notes (Signed)
Progress Note     Subjective: Pt febrile to 103 overnight and reports he feels hot and sweaty. Coughing and it is painful to cough. Reports his niece who he lives with has had a URI. Denies abdominal pain, nausea or vomiting. Denies calf pain. Denies dysuria.   Objective: Vital signs in last 24 hours: Temp:  [98 F (36.7 C)-103 F (39.4 C)] 98.8 F (37.1 C) (05/17 0800) Pulse Rate:  [108-111] 108 (05/16 1655) Resp:  [15-20] 16 (05/17 0637) BP: (86-116)/(49-68) 107/68 (05/17 0637) SpO2:  [92 %-94 %] 94 % (05/17 0637) Last BM Date : 12/22/22  Intake/Output from previous day: 05/16 0701 - 05/17 0700 In: 740 [P.O.:240; IV Piggyback:500] Out: 1300 [Urine:1300] Intake/Output this shift: No intake/output data recorded.  PE: General: pleasant, WD, obese male who is laying in bed, diaphoretic Heart: sinus tachycardia to 110s, palpable pedal pulses Lungs: slight rales bilaterally, O2 saturation 93% on 2L Abd: soft, NT, ND, +BS, no masses, hernias, or organomegaly MS: all 4 extremities are symmetrical with no cyanosis, clubbing, or edema. Negative Homan's bilaterally  Skin: abrasions to BL shins Neuro: non focal exam, speech clear Psych: A&Ox3 with an appropriate affect.   Lab Results:  Recent Labs    12/23/22 0144 12/23/22 1509  WBC 13.6* 10.5  HGB 12.0* 12.5*  HCT 38.1* 39.5  PLT 279 286   BMET Recent Labs    12/22/22 1756 12/22/22 1811 12/23/22 0144  NA 138 139 136  K 4.4 4.4 4.2  CL 99 99 99  CO2 29  --  26  GLUCOSE 204* 204* 190*  BUN 11 11 9   CREATININE 1.36* 1.30* 1.24  CALCIUM 10.0  --  9.4   PT/INR No results for input(s): "LABPROT", "INR" in the last 72 hours. CMP     Component Value Date/Time   NA 136 12/23/2022 0144   K 4.2 12/23/2022 0144   CL 99 12/23/2022 0144   CO2 26 12/23/2022 0144   GLUCOSE 190 (H) 12/23/2022 0144   BUN 9 12/23/2022 0144   CREATININE 1.24 12/23/2022 0144   CALCIUM 9.4 12/23/2022 0144   PROT 7.6 12/22/2022 1756    ALBUMIN 4.1 12/22/2022 1756   AST 253 (H) 12/22/2022 1756   ALT 199 (H) 12/22/2022 1756   ALKPHOS 94 12/22/2022 1756   BILITOT 0.8 12/22/2022 1756   GFRNONAA >60 12/23/2022 0144   Lipase  No results found for: "LIPASE"     Studies/Results: CT CHEST ABDOMEN PELVIS W CONTRAST  Result Date: 12/22/2022 CLINICAL DATA:  Polytrauma, blunt EXAM: CT CHEST, ABDOMEN, AND PELVIS WITH CONTRAST TECHNIQUE: Multidetector CT imaging of the chest, abdomen and pelvis was performed following the standard protocol during bolus administration of intravenous contrast. RADIATION DOSE REDUCTION: This exam was performed according to the departmental dose-optimization program which includes automated exposure control, adjustment of the mA and/or kV according to patient size and/or use of iterative reconstruction technique. CONTRAST:  75mL OMNIPAQUE IOHEXOL 350 MG/ML SOLN COMPARISON:  CT abdomen pelvis 11/01/2020 FINDINGS: CHEST: Cardiovascular: No aortic injury. The thoracic aorta is normal in caliber. The heart is normal in size. No significant pericardial effusion. Mediastinum/Nodes: No pneumomediastinum. No mediastinal hematoma. The esophagus is unremarkable. The thyroid is unremarkable. The central airways are patent. No mediastinal, hilar, or axillary lymphadenopathy. Lungs/Pleura: No focal consolidation. No pulmonary nodule. No pulmonary mass. No pulmonary contusion or laceration. No pneumatocele formation. No pleural effusion. No pneumothorax. No hemothorax. Musculoskeletal/Chest wall: No chest wall mass. Acute nondisplaced fracture of the right anterolateral  second rib. Acute half shaft width displaced posterior right fifth rib fracture. No acute sternal fracture. No spinal fracture. ABDOMEN / PELVIS: Hepatobiliary: Not enlarged. No focal lesion. No laceration or subcapsular hematoma. Status post cholecystectomy.  No biliary ductal dilatation. Pancreas: Normal pancreatic contour. No main pancreatic duct dilatation.  Spleen: Pain spleen is enlarged measuring up to 14 cm. No focal lesion. No laceration, subcapsular hematoma, or vascular injury. Adrenals/Urinary Tract: Fat stranding of the left adrenal gland nodule. No nodularity bilaterally. bilateral kidneys enhance symmetrically. No hydronephrosis. No contusion, laceration, or subcapsular hematoma. Subcentimeter hypodensities are too small to characterize. No injury to the vascular structures or collecting systems. No hydroureter. The urinary bladder is unremarkable. Stomach/Bowel: No small or large bowel wall thickening or dilatation. Colonic diverticulosis. The appendix is unremarkable. Vasculature/Lymphatics: Atherosclerotic plaque. No abdominal aorta or iliac aneurysm. No active contrast extravasation or pseudoaneurysm. No abdominal, pelvic, inguinal lymphadenopathy. Reproductive: Prostate is unremarkable. Other: No simple free fluid ascites. No pneumoperitoneum. No hemoperitoneum. No mesenteric hematoma identified. No organized fluid collection. Musculoskeletal: No significant soft tissue hematoma. Acute minimally displaced right L1 through L5 transverse process fractures. Chronic bilateral L5 pars interarticularis defects. No spinal fracture. Ports and Devices: None. IMPRESSION: 1. Acute nondisplaced fracture of the right anterolateral second rib. Acute half shaft width displaced posterior right fifth rib fracture. No associated pneumothorax. 2. Fat stranding of the left adrenal gland nodule suggestive of adrenal injury/hematoma. No active hemorrhage identified. 3. No acute intrathoracic or intrapelvic traumatic injury. 4. No acute fracture or traumatic malalignment of the thoracic spine. 5. Acute minimally displaced right L1 through L5 transverse process fractures. 6.  Aortic Atherosclerosis (ICD10-I70.0). Electronically Signed   By: Tish Frederickson M.D.   On: 12/22/2022 20:02   CT HEAD WO CONTRAST  Result Date: 12/22/2022 CLINICAL DATA:  Head trauma,  moderate-severe; Polytrauma, blunt EXAM: CT HEAD WITHOUT CONTRAST CT CERVICAL SPINE WITHOUT CONTRAST TECHNIQUE: Multidetector CT imaging of the head and cervical spine was performed following the standard protocol without intravenous contrast. Multiplanar CT image reconstructions of the cervical spine were also generated. RADIATION DOSE REDUCTION: This exam was performed according to the departmental dose-optimization program which includes automated exposure control, adjustment of the mA and/or kV according to patient size and/or use of iterative reconstruction technique. COMPARISON:  None Available. FINDINGS: CT HEAD FINDINGS Brain: No evidence of large-territorial acute infarction. No parenchymal hemorrhage. No mass lesion. No extra-axial collection. No mass effect or midline shift. No hydrocephalus. Basilar cisterns are patent. Vascular: No hyperdense vessel. Skull: No acute fracture or focal lesion. Sinuses/Orbits: Left maxillary sinus mucosal thickening. Otherwise paranasal sinuses and mastoid air cells are clear. The orbits are unremarkable. Other: None. CT CERVICAL SPINE FINDINGS Alignment: Normal. Skull base and vertebrae: No acute fracture. No aggressive appearing focal osseous lesion or focal pathologic process. Soft tissues and spinal canal: No prevertebral fluid or swelling. No visible canal hematoma. Upper chest: Unremarkable. Other: Acute nondisplaced fracture of the right anterolateral second rib. IMPRESSION: 1. No acute intracranial abnormality. 2. No acute displaced fracture or traumatic listhesis of the cervical spine. 3. Acute nondisplaced fracture of the right anterolateral second rib. Electronically Signed   By: Tish Frederickson M.D.   On: 12/22/2022 19:47   CT CERVICAL SPINE WO CONTRAST  Result Date: 12/22/2022 CLINICAL DATA:  Head trauma, moderate-severe; Polytrauma, blunt EXAM: CT HEAD WITHOUT CONTRAST CT CERVICAL SPINE WITHOUT CONTRAST TECHNIQUE: Multidetector CT imaging of the head and  cervical spine was performed following the standard protocol without intravenous contrast. Multiplanar CT  image reconstructions of the cervical spine were also generated. RADIATION DOSE REDUCTION: This exam was performed according to the departmental dose-optimization program which includes automated exposure control, adjustment of the mA and/or kV according to patient size and/or use of iterative reconstruction technique. COMPARISON:  None Available. FINDINGS: CT HEAD FINDINGS Brain: No evidence of large-territorial acute infarction. No parenchymal hemorrhage. No mass lesion. No extra-axial collection. No mass effect or midline shift. No hydrocephalus. Basilar cisterns are patent. Vascular: No hyperdense vessel. Skull: No acute fracture or focal lesion. Sinuses/Orbits: Left maxillary sinus mucosal thickening. Otherwise paranasal sinuses and mastoid air cells are clear. The orbits are unremarkable. Other: None. CT CERVICAL SPINE FINDINGS Alignment: Normal. Skull base and vertebrae: No acute fracture. No aggressive appearing focal osseous lesion or focal pathologic process. Soft tissues and spinal canal: No prevertebral fluid or swelling. No visible canal hematoma. Upper chest: Unremarkable. Other: Acute nondisplaced fracture of the right anterolateral second rib. IMPRESSION: 1. No acute intracranial abnormality. 2. No acute displaced fracture or traumatic listhesis of the cervical spine. 3. Acute nondisplaced fracture of the right anterolateral second rib. Electronically Signed   By: Tish Frederickson M.D.   On: 12/22/2022 19:47   DG Chest Port 1 View  Result Date: 12/22/2022 CLINICAL DATA:  Fall onto back EXAM: PORTABLE CHEST 1 VIEW COMPARISON:  Chest radiograph dated 08/18/2022 FINDINGS: Normal lung volumes. Bibasilar patchy opacities. No pleural effusion or pneumothorax. The heart size and mediastinal contours are within normal limits. Minimally displaced right posterior fifth rib fracture. IMPRESSION: 1.  Minimally displaced right posterior fifth rib fracture. 2. Bibasilar patchy opacities, likely atelectasis. Electronically Signed   By: Agustin Cree M.D.   On: 12/22/2022 19:13   DG Pelvis Portable  Result Date: 12/22/2022 CLINICAL DATA:  Fall EXAM: PORTABLE PELVIS 1-2 VIEWS COMPARISON:  None Available. FINDINGS: There is no evidence of pelvic fracture or diastasis. No pelvic bone lesions are seen. IMPRESSION: Negative. Electronically Signed   By: Charlett Nose M.D.   On: 12/22/2022 19:10    Anti-infectives: Anti-infectives (From admission, onward)    None        Assessment/Plan  Fall off roof  Right 2,5 rib fractures - multimodal pain control, Pulm toilet, IS R lumbar 1-5 TVP fractures - pain control, PT/OT Left adrenal contusion - hgb 12 yesterday from 14.5, continue to monitor  Abrasions to BLE - ok to shower/cleanse wounds with soap and water, local wound care Fever/cough - febrile to 103 overnight, diaphoretic this AM, CXR, COVID swab/resp panel T2DM - SSI  FEN: CM diet VTE: LMWH ID: no current abx   Dispo: labs and resp swabs this AM, precautions until swabs are resulted. CXR   LOS: 0 days   I reviewed last 24 h vitals and pain scores, last 48 h intake and output, last 24 h labs and trends, last 24 h imaging results, and therapy notes .    Juliet Rude, [facility] 12/24/2022, 8:23 AM Please see Amion for pager number during day hours 7:00am-4:30pm

## 2022-12-24 NOTE — Progress Notes (Signed)
Physical Therapy Treatment Patient Details Name: Leon Ross MRN: [id number] DOB: 05/13/1971 Today's Date: 12/24/2022   History of Present Illness 52 yo male sustained a fall from a roof on 5/15, admitted for evaluation.  Has fractures of R 2nd and 5th ribs, of TP fractures on lumbar 1-5 on R.  Difficulty using RLE to lift up.  Has landed on spine and hit his head, was unable to respond initially to his family members.  PMHx:  Anxiety, schizoaffective disorder, sleep apnea, HLD, HTN, gastroparesis, DM, asthma, depression,    PT Comments    Pt received in supine and agreeable to session. Pt reporting increased rib pain this session, limiting activity tolerance. Pt able to perform bed mobility, standing trials, and gait trial with min guard for safety. Pt demonstrating slow, steady movements due to pain.  Pt continues to benefit from PT services to progress toward functional mobility goals.     Recommendations for follow up therapy are one component of a multi-disciplinary discharge planning process, led by the attending physician.  Recommendations may be updated based on patient status, additional functional criteria and insurance authorization.     Assistance Recommended at Discharge Intermittent Supervision/Assistance  Patient can return home with the following A little help with walking and/or transfers;A little help with bathing/dressing/bathroom;Assistance with cooking/housework;Assist for transportation;Help with stairs or ramp for entrance   Equipment Recommendations  None recommended by PT    Recommendations for Other Services       Precautions / Restrictions Precautions Precautions: Other (comment) Precaution Comments: watch BP Restrictions Weight Bearing Restrictions: No     Mobility  Bed Mobility Overal bed mobility: Needs Assistance Bed Mobility: Supine to Sit     Supine to sit: Min guard     General bed mobility comments: Use of bedrails and increased  time    Transfers Overall transfer level: Needs assistance Equipment used: None Transfers: Sit to/from Stand Sit to Stand: Min guard           General transfer comment: From EOB x1 and recliner x3 with slow, steady power up. Min guard for safety    Ambulation/Gait Ambulation/Gait assistance: Min guard Gait Distance (Feet): 15 Feet Assistive device: Rolling walker (2 wheels) Gait Pattern/deviations: Step-through pattern, Decreased stride length, Wide base of support       General Gait Details: Pt performing slow steady gait in room with min guard for safety.      Balance Overall balance assessment: Needs assistance Sitting-balance support: Feet supported Sitting balance-Leahy Scale: Good Sitting balance - Comments: sitting EOB   Standing balance support: Bilateral upper extremity supported, During functional activity Standing balance-Leahy Scale: Good Standing balance comment: with RW support and static standing without UE support                            Cognition Arousal/Alertness: Awake/alert Behavior During Therapy: WFL for tasks assessed/performed Overall Cognitive Status: Within Functional Limits for tasks assessed                                          Exercises      General Comments General comments (skin integrity, edema, etc.): VSS on RA. SpO2 dropped as low as 86% during mobility, however improved quickly with pursed lip breathing      Pertinent Vitals/Pain Pain Assessment Pain Assessment: 0-10 Pain Score: 7  Pain Location: R back pain under scapula, lumbar spine, B shins Pain Descriptors / Indicators: Guarding, Grimacing, Aching Pain Intervention(s): Monitored during session, Repositioned, Limited activity within patient's tolerance     PT Goals (current goals can now be found in the care plan section) Acute Rehab PT Goals Patient Stated Goal: to walk and go home PT Goal Formulation: With patient Time For  Goal Achievement: 01/06/23 Potential to Achieve Goals: Good Progress towards PT goals: Progressing toward goals    Frequency    Min 3X/week      PT Plan Current plan remains appropriate       AM-PAC PT "6 Clicks" Mobility   Outcome Measure  Help needed turning from your back to your side while in a flat bed without using bedrails?: A Little Help needed moving from lying on your back to sitting on the side of a flat bed without using bedrails?: A Little Help needed moving to and from a bed to a chair (including a wheelchair)?: A Little Help needed standing up from a chair using your arms (e.g., wheelchair or bedside chair)?: A Little Help needed to walk in hospital room?: A Little Help needed climbing 3-5 steps with a railing? : A Little 6 Click Score: 18    End of Session Equipment Utilized During Treatment: Gait belt Activity Tolerance: Patient tolerated treatment well;Patient limited by pain Patient left: in chair;with call bell/phone within reach Nurse Communication: Mobility status PT Visit Diagnosis: Unsteadiness on feet (R26.81);Other abnormalities of gait and mobility (R26.89);Pain Pain - Right/Left: Right Pain - part of body:  (side)     Time: 6213-0865 PT Time Calculation (min) (ACUTE ONLY): 24 min  Charges:  $Gait Training: 8-22 mins $Therapeutic Activity: 8-22 mins                     Johny Shock, PTA Acute Rehabilitation Services Secure Chat Preferred  [phone]    Johny Shock 12/24/2022, 3:53 PM

## 2022-12-24 NOTE — Inpatient Diabetes Management (Signed)
Inpatient Diabetes Program Recommendations  AACE/ADA: New Consensus Statement on Inpatient Glycemic Control (2015)  Target Ranges:  Prepandial:   less than 140 mg/dL      Peak postprandial:   less than 180 mg/dL (1-2 hours)      Critically ill patients:  140 - 180 mg/dL   Lab Results  Component Value Date   GLUCAP 338 (H) 12/24/2022   HGBA1C 7.4 (H) 12/23/2022    Review of Glycemic Control  Latest Reference Range & Units 12/23/22 16:14 12/23/22 20:18 12/24/22 08:00 12/24/22 11:47  Glucose-Capillary 70 - 99 mg/dL 960 (H) 454 (H) 098 (H) 338 (H)  (H): Data is abnormally high Diabetes history: Type 2 DM Outpatient Diabetes medications: Farxiga 10 mg QD, Glyburide 5 mg BID, Basaglar 20 units TID, Metformin 500 mg BID Current orders for Inpatient glycemic control: Novolog 0-20 units TID & HS  Inpatient Diabetes Program Recommendations:    If to remain inpatient consider: -Adding Semglee 20 units QHS - Novolog 4 units TID (Assuming patient is consuming >50% of meals)  Thanks, Lujean Rave, MSN, RNC-OB Diabetes Coordinator [phone] (8a-5p)

## 2022-12-25 LAB — CBC
HCT: 39.3 % (ref 39.0–52.0)
Hemoglobin: 12.4 g/dL — ABNORMAL LOW (ref 13.0–17.0)
MCH: 24.4 pg — ABNORMAL LOW (ref 26.0–34.0)
MCHC: 31.6 g/dL (ref 30.0–36.0)
MCV: 77.4 fL — ABNORMAL LOW (ref 80.0–100.0)
Platelets: 261 10*3/uL (ref 150–400)
RBC: 5.08 MIL/uL (ref 4.22–5.81)
RDW: 16.3 % — ABNORMAL HIGH (ref 11.5–15.5)
WBC: 9.4 10*3/uL (ref 4.0–10.5)
nRBC: 0 % (ref 0.0–0.2)

## 2022-12-25 LAB — GLUCOSE, CAPILLARY
Glucose-Capillary: 154 mg/dL — ABNORMAL HIGH (ref 70–99)
Glucose-Capillary: 204 mg/dL — ABNORMAL HIGH (ref 70–99)

## 2022-12-25 MED ORDER — METHOCARBAMOL 1000 MG PO TABS: 1000.0000 mg | ORAL_TABLET | Freq: Three times a day (TID) | ORAL | 0 refills | Status: AC | PRN

## 2022-12-25 MED ORDER — OXYCODONE HCL 5 MG PO TABS: 5.0000 mg | ORAL_TABLET | ORAL | 0 refills | Status: AC | PRN

## 2022-12-25 MED ORDER — DOCUSATE SODIUM 100 MG PO CAPS: 100.0000 mg | ORAL_CAPSULE | Freq: Two times a day (BID) | ORAL | 0 refills | Status: DC | PRN

## 2022-12-25 MED ORDER — ACETAMINOPHEN 500 MG PO TABS: 1000.0000 mg | ORAL_TABLET | Freq: Four times a day (QID) | ORAL | Status: DC | PRN

## 2022-12-25 MED ORDER — BACITRACIN ZINC 500 UNIT/GM EX OINT: TOPICAL_OINTMENT | Freq: Every day | CUTANEOUS | 0 refills | Status: DC

## 2022-12-25 MED ORDER — GUAIFENESIN ER 600 MG PO TB12: 600.0000 mg | ORAL_TABLET | Freq: Two times a day (BID) | ORAL | 0 refills | Status: AC

## 2022-12-25 NOTE — Progress Notes (Signed)
Patient was unable to produce a sputum sample.  He is discharge to home.  He is alert and oriented.  Patient's wife and niece are at the bedside.  Pt is going to lobby via wheelchair. Discharge information was discussed with the patient and he was given his AVS.  Patient verbalized understanding.

## 2022-12-25 NOTE — Plan of Care (Signed)
  Problem: Clinical Measurements: Goal: Respiratory complications will improve Outcome: Progressing Goal: Cardiovascular complication will be avoided Outcome: Progressing   Problem: Activity: Goal: Risk for activity intolerance will decrease Outcome: Progressing   Problem: Nutrition: Goal: Adequate nutrition will be maintained Outcome: Progressing   

## 2022-12-25 NOTE — Progress Notes (Signed)
Pt was not able to expectorate a sputum for sampling at this time. Collecting cup left in the room within pt's reach, pt stated that he will collect a sample and place it into the cup when able to. RN notified.

## 2022-12-25 NOTE — Progress Notes (Cosign Needed Addendum)
Progress Note     Subjective: Afebrile 24h not on abx. WBC today pending. Still having pain from ribs and back but breathing is the same to better. No longer coughing. Does endorse diaphoresis overnight. He has gotten out of bed and walked in room some. Not walked in hallway  Objective: Vital signs in last 24 hours: Temp:  [98 F (36.7 C)-99 F (37.2 C)] 99 F (37.2 C) (05/18 0745) Pulse Rate:  [89-100] 100 (05/18 0745) Resp:  [11-16] 16 (05/18 0745) BP: (103-116)/(64-74) 116/71 (05/18 0745) SpO2:  [90 %-96 %] 96 % (05/18 0745) Last BM Date : 12/22/22  Intake/Output from previous day: 05/17 0701 - 05/18 0700 In: 1427.5 [P.O.:480; I.V.:947.5] Out: 2450 [Urine:2450] Intake/Output this shift: No intake/output data recorded.  PE: General: pleasant, WD, obese male who is laying in bed Heart: sinus tachycardia to 110s, palpable pedal pulses Lungs: slight rales bilaterally, O2 saturation 93% on 3L initial exam. O2 dc during my exam and sats remained greater than 93% Abd: soft, NT, ND MS: all 4 extremities are symmetrical with no cyanosis, clubbing, or edema. Negative Homan's bilaterally  Skin: abrasions to BL shins Neuro: non focal exam, speech clear Psych: A&Ox3 with an appropriate affect.   Lab Results:  Recent Labs    12/23/22 1509 12/24/22 0900  WBC 10.5 11.2*  HGB 12.5* 12.8*  HCT 39.5 40.8  PLT 286 283    BMET Recent Labs    12/23/22 0144 12/24/22 0900  NA 136 134*  K 4.2 4.4  CL 99 98  CO2 26 27  GLUCOSE 190* 210*  BUN 9 12  CREATININE 1.24 1.29*  CALCIUM 9.4 8.8*    PT/INR No results for input(s): "LABPROT", "INR" in the last 72 hours. CMP     Component Value Date/Time   NA 134 (L) 12/24/2022 0900   K 4.4 12/24/2022 0900   CL 98 12/24/2022 0900   CO2 27 12/24/2022 0900   GLUCOSE 210 (H) 12/24/2022 0900   BUN 12 12/24/2022 0900   CREATININE 1.29 (H) 12/24/2022 0900   CALCIUM 8.8 (L) 12/24/2022 0900   PROT 7.6 12/22/2022 1756   ALBUMIN  4.1 12/22/2022 1756   AST 253 (H) 12/22/2022 1756   ALT 199 (H) 12/22/2022 1756   ALKPHOS 94 12/22/2022 1756   BILITOT 0.8 12/22/2022 1756   GFRNONAA >60 12/24/2022 0900   Lipase  No results found for: "LIPASE"     Studies/Results: DG CHEST PORT 1 VIEW  Result Date: 12/24/2022 CLINICAL DATA:  52 year old male fever of unknown origin. EXAM: PORTABLE CHEST 1 VIEW COMPARISON:  CT Chest, Abdomen, and Pelvis 12/22/2022 and earlier. FINDINGS: Portable AP semi upright view at 0846 hours. Stable mildly lower lung volumes. Mediastinal contours remain normal. Visualized tracheal air column is within normal limits. Allowing for portable technique the lungs are clear. Posterior right 5th rib fracture redemonstrated. Stable visualized osseous structures. IMPRESSION: Right rib fractures.  No acute cardiopulmonary abnormality. Electronically Signed   By: Odessa Fleming M.D.   On: 12/24/2022 11:48    Anti-infectives: Anti-infectives (From admission, onward)    None        Assessment/Plan  Fall off roof Right 2,5 rib fractures - multimodal pain control, Pulm toilet, IS (instructed on use this am) R lumbar 1-5 TVP fractures - pain control, PT/OT. Left adrenal contusion - hgb 12 yesterday. Repeat pending  Abrasions to BLE - ok to shower/cleanse wounds with soap and water, local wound care Fever/cough - afebrile 24h, CXR 5/17 with  rib fxs otherwise no acute abnormality, COVID swab/resp panel all neg T2DM - SSI OSA - does not wear cpap  FEN: CM diet VTE: LMWH ID: no current abx   Dispo: await am cbc, monitor oxygenation off of supplemental O2. Possible dc today vs tomorrow. He states he has support at home.    LOS: 1 day   I reviewed last 24 h vitals and pain scores, last 48 h intake and output, last 24 h labs and trends, last 24 h imaging results, and therapy notes .    Eric Form, [facility] 12/25/2022, 9:53 AM Please see Amion for pager number during day hours  7:00am-4:30pm

## 2022-12-27 ENCOUNTER — Emergency Department (HOSPITAL_COMMUNITY)
Admission: EM | Admit: 2022-12-27 | Discharge: 2022-12-27 | Disposition: A | Discharge status: Home / Self Care | Payer: Medicaid Other | Attending: Emergency Medicine | Admitting: Emergency Medicine

## 2022-12-27 ENCOUNTER — Other Ambulatory Visit: Payer: Self-pay

## 2022-12-27 ENCOUNTER — Encounter (HOSPITAL_COMMUNITY): Payer: Self-pay | Admitting: Emergency Medicine

## 2022-12-27 DIAGNOSIS — E1143 Type 2 diabetes mellitus with diabetic autonomic (poly)neuropathy: Secondary | ICD-10-CM | POA: Insufficient documentation

## 2022-12-27 DIAGNOSIS — S80812A Abrasion, left lower leg, initial encounter: Secondary | ICD-10-CM | POA: Insufficient documentation

## 2022-12-27 DIAGNOSIS — S32019D Unspecified fracture of first lumbar vertebra, subsequent encounter for fracture with routine healing: Secondary | ICD-10-CM | POA: Diagnosis not present

## 2022-12-27 DIAGNOSIS — Z79899 Other long term (current) drug therapy: Secondary | ICD-10-CM | POA: Insufficient documentation

## 2022-12-27 DIAGNOSIS — Z7984 Long term (current) use of oral hypoglycemic drugs: Secondary | ICD-10-CM | POA: Diagnosis not present

## 2022-12-27 DIAGNOSIS — I10 Essential (primary) hypertension: Secondary | ICD-10-CM | POA: Diagnosis not present

## 2022-12-27 DIAGNOSIS — R519 Headache, unspecified: Secondary | ICD-10-CM | POA: Insufficient documentation

## 2022-12-27 DIAGNOSIS — S80811A Abrasion, right lower leg, initial encounter: Secondary | ICD-10-CM | POA: Insufficient documentation

## 2022-12-27 DIAGNOSIS — S32059D Unspecified fracture of fifth lumbar vertebra, subsequent encounter for fracture with routine healing: Secondary | ICD-10-CM | POA: Diagnosis not present

## 2022-12-27 DIAGNOSIS — S3992XD Unspecified injury of lower back, subsequent encounter: Secondary | ICD-10-CM | POA: Diagnosis present

## 2022-12-27 DIAGNOSIS — W132XXD Fall from, out of or through roof, subsequent encounter: Secondary | ICD-10-CM | POA: Diagnosis not present

## 2022-12-27 DIAGNOSIS — Z794 Long term (current) use of insulin: Secondary | ICD-10-CM | POA: Diagnosis not present

## 2022-12-27 DIAGNOSIS — S32009D Unspecified fracture of unspecified lumbar vertebra, subsequent encounter for fracture with routine healing: Secondary | ICD-10-CM

## 2022-12-27 MED ORDER — OXYCODONE HCL 5 MG PO TABS
5.0000 mg | ORAL_TABLET | Freq: Once | ORAL | Status: AC
  Administered 2022-12-27: 5 mg via ORAL
  Filled 2022-12-27: qty 1

## 2022-12-27 MED ORDER — ACETAMINOPHEN 500 MG PO TABS
1000.0000 mg | ORAL_TABLET | Freq: Once | ORAL | Status: AC
  Administered 2022-12-27: 1000 mg via ORAL
  Filled 2022-12-27: qty 2

## 2022-12-27 MED ORDER — IBUPROFEN 400 MG PO TABS
600.0000 mg | ORAL_TABLET | Freq: Once | ORAL | Status: AC
  Administered 2022-12-27: 600 mg via ORAL
  Filled 2022-12-27: qty 1

## 2022-12-27 NOTE — ED Notes (Signed)
Provider bedside.

## 2022-12-27 NOTE — Discharge Instructions (Addendum)
You were seen today for back pain.  This is due to multiple transverse process fractures in your lumbar spine.  You should call the number above to schedule follow-up with spine surgery.  If you develop severe pain or any other new concerning symptoms you should return to the ED.

## 2022-12-27 NOTE — ED Triage Notes (Addendum)
Presents from home for fall Wednesday from roof of mobile home. Was diagnosed and admitted for rib fractures to 5N, states that he was not treated for spinal fx at that time.  Today experiencing increased HA, nausea. PCP instructed patient to return to ER because she saw an unstable fracture on admission imaging that was not adequately addressed.   EMS vitals CBG138, SBP120, 70bpm, 94% RA Ambulatory with pain at home.   Has not picked up home prescriptions for pain. Has had tylenol at 5pm today.

## 2022-12-27 NOTE — ED Notes (Signed)
During discharge discussion, pt requested MD to look at leg wounds and eval for infection. MD notified.

## 2022-12-27 NOTE — ED Provider Notes (Signed)
[place] EMERGENCY DEPARTMENT AT [facility] Provider Note   CSN: [id number] Arrival date & time: 12/27/22  1907     History  Chief Complaint  Patient presents with   recheck spinal CT    Leon Ross is a 52 y.o. male.  HPI 52 year old male history of depression, anxiety, diabetes, gastroparesis, GERD, hypertension, hyperlipidemia presenting for spinal fractures.  Patient states last Wednesday he was on his roof putting a tarp up when he fell.  He fell and landed on his back.  He was seen here admitted to the hospital.  CT chest abdomen pelvis showed 2 rib fractures, L1-L5 TP fractures, no cervical spine fracture, no acute intracranial abnormality.  He was admitted to the hospital for pain control and discharged home.  Has not been able to pick up his medications because he cannot afford them.  He called his PCP office today to schedule follow-up.  He reports that they told him he needed to go to the ED because he had multiple spinal fractures that were not managed appropriately.  He continues to have right-sided lower back pain and occasional headache.  No new trauma or fall.  No vision changes or weakness or numbness.     Home Medications Prior to Admission medications   Medication Sig Start Date End Date Taking? Authorizing Provider  acetaminophen (TYLENOL) 500 MG tablet Take 2 tablets (1,000 mg total) by mouth every 6 (six) hours as needed for mild pain or moderate pain. 12/25/22   Eric Form, PA-C  atorvastatin (LIPITOR) 40 MG tablet Take 40 mg by mouth at bedtime. 10/14/22   [provider]  bacitracin ointment Apply topically daily. To abrasions 12/26/22   Eric Form, PA-C  dapagliflozin propanediol (FARXIGA) 10 MG TABS tablet Take 10 mg by mouth daily.    [provider]  docusate sodium (COLACE) 100 MG capsule Take 1 capsule (100 mg total) by mouth 2 (two) times daily as needed for mild constipation or moderate constipation. 12/25/22    Eric Form, PA-C  famotidine (PEPCID) 20 MG tablet Take 20 mg by mouth daily.    [provider]  gabapentin (NEURONTIN) 300 MG capsule Take 300 mg by mouth 3 (three) times daily.    [provider]  glyBURIDE (DIABETA) 5 MG tablet Take 5 mg by mouth 2 (two) times daily. 04/03/21   [provider]  guaiFENesin (MUCINEX) 600 MG 12 hr tablet Take 1 tablet (600 mg total) by mouth 2 (two) times daily for 3 days. 12/25/22 12/28/22  Eric Form, PA-C  hydrOXYzine (ATARAX/VISTARIL) 25 MG tablet Take 1 tablet (25 mg total) by mouth at bedtime. 05/29/21   Lamar Sprinkles, MD  Insulin Glargine (BASAGLAR KWIKPEN) 100 UNIT/ML Inject 20 Units into the skin 3 (three) times daily. 08/20/22   [provider]  lisinopril (ZESTRIL) 10 MG tablet Take 10 mg by mouth daily. 09/01/21   [provider]  metFORMIN (GLUCOPHAGE-XR) 500 MG 24 hr tablet Take 500 mg by mouth 2 (two) times daily. 04/03/21   [provider]  methocarbamol 1000 MG TABS Take 1,000 mg by mouth every 8 (eight) hours as needed for up to 5 days for muscle spasms. 12/25/22 12/30/22  Eric Form, PA-C  mirtazapine (REMERON) 15 MG tablet Take 15 mg by mouth at bedtime. 03/16/21   [provider]  omeprazole (PRILOSEC) 40 MG capsule Take 40 mg by mouth daily as needed (for acid reflux). 04/28/21   [provider]  ondansetron (ZOFRAN) 8 MG tablet Take 1 tablet (8 mg total) by mouth every 8 (eight) hours as needed for nausea or vomiting. 11/23/22   Doree Albee, PA-C  oxyCODONE (OXY IR/ROXICODONE) 5 MG immediate release tablet Take 1 tablet (5 mg total) by mouth every 4 (four) hours as needed for up to 5 days for moderate pain or severe pain. 12/25/22 12/30/22  Eric Form, PA-C  paliperidone (INVEGA) 9 MG 24 hr tablet Take 9 mg by mouth every morning. 10/18/22   [provider]  prazosin (MINIPRESS) 5 MG capsule Take 5 mg by mouth at bedtime.    [provider]  traZODone (DESYREL) 150 MG tablet Take 1 tablet (150 mg total) by mouth at bedtime. 05/29/21   Lamar Sprinkles, MD      Allergies    Lithium, Benztropine, and Depakote [divalproex sodium]    Review of Systems   Review of Systems  Musculoskeletal:  Positive for back pain.  Neurological:  Positive for headaches.  All other systems reviewed and are negative.   Physical Exam Updated Vital Signs BP 124/73 (BP Location: Left Arm)   Pulse 94   Temp 98.3 F (36.8 C) (Oral)   Resp 16   Wt 124 kg   SpO2 93%   BMI 41.57 kg/m  Physical Exam Vitals and nursing note reviewed.  Constitutional:      General: He is not in acute distress.    Appearance: He is well-developed.  HENT:     Head: Normocephalic and atraumatic.     Nose: Nose normal.     Mouth/Throat:     Mouth: Mucous membranes are moist.     Pharynx: Oropharynx is clear.  Eyes:     Extraocular Movements: Extraocular movements intact.     Conjunctiva/sclera: Conjunctivae normal.     Pupils: Pupils are equal, round, and reactive to light.  Cardiovascular:     Rate and Rhythm: Normal rate and regular rhythm.     Heart sounds: No murmur heard. Pulmonary:     Effort: Pulmonary effort is normal. No respiratory distress.     Breath sounds: Normal breath sounds.  Abdominal:     Palpations: Abdomen is soft.     Tenderness: There is no abdominal tenderness.  Musculoskeletal:        General: No swelling.     Cervical back: Neck supple.     Right lower leg: No edema.     Left lower leg: No edema.     Comments: Abrasions on bilateral shins.  No induration, fluctuance, erythema to suggest infection.  Mild tenderness to right lumbar paraspinal muscles.  No other spinal tenderness.  Skin:    General: Skin is warm and dry.     Capillary Refill: Capillary refill takes less than 2 seconds.  Neurological:     General: No focal deficit present.     Mental Status: He is alert and oriented to person, place, and time.  Mental status is at baseline.     Cranial Nerves: No cranial nerve deficit.     Sensory: No sensory deficit.     Motor: No weakness.  Psychiatric:        Mood and Affect: Mood normal.     ED Results / Procedures / Treatments   Labs (all labs ordered are listed, but only abnormal results are displayed) Labs Reviewed - No data to display  EKG None  Radiology No results found.  Procedures Procedures    Medications Ordered in  ED Medications  oxyCODONE (Oxy IR/ROXICODONE) immediate release tablet 5 mg (5 mg Oral Given 12/27/22 2022)  acetaminophen (TYLENOL) tablet 1,000 mg (1,000 mg Oral Given 12/27/22 2022)  ibuprofen (ADVIL) tablet 600 mg (600 mg Oral Given 12/27/22 2022)    ED Course/ Medical Decision Making/ A&P                             Medical Decision Making Risk OTC drugs. Prescription drug management.   52 year old male presenting for right lower back pain.  Patient had fall last week, no new injury.  Vital signs reviewed.  Here he has tenderness over his right lumbar spine on the right side consistent with his known transverse process fractures.  He has not had any new injury, and otherwise is doing well.  Has occasional headache consistent with possible concussion but no deficits and had appropriate imaging including CT head during his initial fall.  Per initial ED note from his prior visit, neurosurgery was contacted regarding his TP fractures who recommended pain control no indication for bracing or other acute management.  Patient has no acute worsening of his pain, but has had difficulty filling his prescriptions.  He has no signs of unstable fracture or indication for repeat imaging given no new trauma.  Will give him Tylenol, Motrin, oxycodone here to help with pain control.  I did give him follow-up with the spine surgeon for further evaluation.  Patient was agreeable with this plan.  Was discharged home in stable condition with return  precautions.        Final Clinical Impression(s) / ED Diagnoses Final diagnoses:  Closed fracture of transverse process of lumbar vertebra with routine healing, subsequent encounter    Rx / DC Orders ED Discharge Orders     None         Fulton Reek, MD 12/27/22 2310    Melene Plan, DO 12/27/22 2313
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
## 2023-01-11 DIAGNOSIS — F250 Schizoaffective disorder, bipolar type: Secondary | ICD-10-CM | POA: Diagnosis not present

## 2023-01-12 DIAGNOSIS — F339 Major depressive disorder, recurrent, unspecified: Secondary | ICD-10-CM | POA: Diagnosis not present

## 2023-01-15 ENCOUNTER — Ambulatory Visit
Admission: RE | Admit: 2023-01-15 | Discharge: 2023-01-15 | Disposition: A | Discharge status: Home / Self Care | Payer: Medicaid Other | Source: Ambulatory Visit | Attending: Orthopedic Surgery | Admitting: Orthopedic Surgery

## 2023-01-15 DIAGNOSIS — M23200 Derangement of unspecified lateral meniscus due to old tear or injury, right knee: Secondary | ICD-10-CM

## 2023-01-15 DIAGNOSIS — G8929 Other chronic pain: Secondary | ICD-10-CM

## 2023-01-19 DIAGNOSIS — F339 Major depressive disorder, recurrent, unspecified: Secondary | ICD-10-CM | POA: Diagnosis not present

## 2023-01-31 ENCOUNTER — Ambulatory Visit: Payer: Medicaid Other | Admitting: Orthopedic Surgery

## 2023-02-02 DIAGNOSIS — F339 Major depressive disorder, recurrent, unspecified: Secondary | ICD-10-CM | POA: Diagnosis not present

## 2023-02-09 DIAGNOSIS — F339 Major depressive disorder, recurrent, unspecified: Secondary | ICD-10-CM | POA: Diagnosis not present

## 2023-02-11 DIAGNOSIS — R350 Frequency of micturition: Secondary | ICD-10-CM | POA: Diagnosis not present

## 2023-02-13 ENCOUNTER — Other Ambulatory Visit: Payer: Self-pay | Admitting: Physician Assistant

## 2023-02-14 DIAGNOSIS — F250 Schizoaffective disorder, bipolar type: Secondary | ICD-10-CM | POA: Diagnosis not present

## 2023-02-16 DIAGNOSIS — F339 Major depressive disorder, recurrent, unspecified: Secondary | ICD-10-CM | POA: Diagnosis not present

## 2023-02-23 DIAGNOSIS — F339 Major depressive disorder, recurrent, unspecified: Secondary | ICD-10-CM | POA: Diagnosis not present

## 2023-03-02 DIAGNOSIS — F339 Major depressive disorder, recurrent, unspecified: Secondary | ICD-10-CM | POA: Diagnosis not present

## 2023-03-16 DIAGNOSIS — F339 Major depressive disorder, recurrent, unspecified: Secondary | ICD-10-CM | POA: Diagnosis not present

## 2023-03-22 DIAGNOSIS — F250 Schizoaffective disorder, bipolar type: Secondary | ICD-10-CM | POA: Diagnosis not present

## 2023-03-23 DIAGNOSIS — F339 Major depressive disorder, recurrent, unspecified: Secondary | ICD-10-CM | POA: Diagnosis not present

## 2023-03-30 DIAGNOSIS — F339 Major depressive disorder, recurrent, unspecified: Secondary | ICD-10-CM | POA: Diagnosis not present

## 2023-04-06 DIAGNOSIS — F339 Major depressive disorder, recurrent, unspecified: Secondary | ICD-10-CM | POA: Diagnosis not present

## 2023-04-20 ENCOUNTER — Ambulatory Visit: Payer: 59 | Admitting: Internal Medicine

## 2023-04-27 DIAGNOSIS — F339 Major depressive disorder, recurrent, unspecified: Secondary | ICD-10-CM | POA: Diagnosis not present

## 2023-05-18 DIAGNOSIS — F339 Major depressive disorder, recurrent, unspecified: Secondary | ICD-10-CM | POA: Diagnosis not present

## 2023-05-25 DIAGNOSIS — F339 Major depressive disorder, recurrent, unspecified: Secondary | ICD-10-CM | POA: Diagnosis not present

## 2023-06-01 DIAGNOSIS — F339 Major depressive disorder, recurrent, unspecified: Secondary | ICD-10-CM | POA: Diagnosis not present

## 2023-06-09 DIAGNOSIS — F339 Major depressive disorder, recurrent, unspecified: Secondary | ICD-10-CM | POA: Diagnosis not present

## 2023-06-15 DIAGNOSIS — F339 Major depressive disorder, recurrent, unspecified: Secondary | ICD-10-CM | POA: Diagnosis not present

## 2023-06-18 DIAGNOSIS — E1140 Type 2 diabetes mellitus with diabetic neuropathy, unspecified: Secondary | ICD-10-CM | POA: Diagnosis not present

## 2023-06-18 DIAGNOSIS — Z720 Tobacco use: Secondary | ICD-10-CM | POA: Diagnosis not present

## 2023-06-18 DIAGNOSIS — E1151 Type 2 diabetes mellitus with diabetic peripheral angiopathy without gangrene: Secondary | ICD-10-CM | POA: Diagnosis not present

## 2023-06-18 DIAGNOSIS — Z9181 History of falling: Secondary | ICD-10-CM | POA: Diagnosis not present

## 2023-06-18 DIAGNOSIS — Z8249 Family history of ischemic heart disease and other diseases of the circulatory system: Secondary | ICD-10-CM | POA: Diagnosis not present

## 2023-06-18 DIAGNOSIS — F419 Anxiety disorder, unspecified: Secondary | ICD-10-CM | POA: Diagnosis not present

## 2023-06-18 DIAGNOSIS — F259 Schizoaffective disorder, unspecified: Secondary | ICD-10-CM | POA: Diagnosis not present

## 2023-06-18 DIAGNOSIS — E785 Hyperlipidemia, unspecified: Secondary | ICD-10-CM | POA: Diagnosis not present

## 2023-06-18 DIAGNOSIS — I10 Essential (primary) hypertension: Secondary | ICD-10-CM | POA: Diagnosis not present

## 2023-06-18 DIAGNOSIS — Z818 Family history of other mental and behavioral disorders: Secondary | ICD-10-CM | POA: Diagnosis not present

## 2023-06-18 DIAGNOSIS — F319 Bipolar disorder, unspecified: Secondary | ICD-10-CM | POA: Diagnosis not present

## 2023-06-18 DIAGNOSIS — Z7984 Long term (current) use of oral hypoglycemic drugs: Secondary | ICD-10-CM | POA: Diagnosis not present

## 2023-06-22 DIAGNOSIS — F339 Major depressive disorder, recurrent, unspecified: Secondary | ICD-10-CM | POA: Diagnosis not present

## 2023-06-24 DIAGNOSIS — R112 Nausea with vomiting, unspecified: Secondary | ICD-10-CM | POA: Diagnosis not present

## 2023-06-24 DIAGNOSIS — R748 Abnormal levels of other serum enzymes: Secondary | ICD-10-CM | POA: Diagnosis not present

## 2023-06-24 DIAGNOSIS — E1165 Type 2 diabetes mellitus with hyperglycemia: Secondary | ICD-10-CM | POA: Diagnosis not present

## 2023-06-24 DIAGNOSIS — R29898 Other symptoms and signs involving the musculoskeletal system: Secondary | ICD-10-CM | POA: Diagnosis not present

## 2023-06-24 DIAGNOSIS — R918 Other nonspecific abnormal finding of lung field: Secondary | ICD-10-CM | POA: Diagnosis not present

## 2023-07-06 DIAGNOSIS — F339 Major depressive disorder, recurrent, unspecified: Secondary | ICD-10-CM | POA: Diagnosis not present

## 2023-07-12 DIAGNOSIS — E785 Hyperlipidemia, unspecified: Secondary | ICD-10-CM | POA: Diagnosis not present

## 2023-07-12 DIAGNOSIS — E559 Vitamin D deficiency, unspecified: Secondary | ICD-10-CM | POA: Diagnosis not present

## 2023-07-12 DIAGNOSIS — H538 Other visual disturbances: Secondary | ICD-10-CM | POA: Diagnosis not present

## 2023-07-12 DIAGNOSIS — E1165 Type 2 diabetes mellitus with hyperglycemia: Secondary | ICD-10-CM | POA: Diagnosis not present

## 2023-07-13 DIAGNOSIS — F339 Major depressive disorder, recurrent, unspecified: Secondary | ICD-10-CM | POA: Diagnosis not present

## 2023-07-18 ENCOUNTER — Ambulatory Visit: Payer: 59 | Admitting: Internal Medicine

## 2023-07-20 DIAGNOSIS — F339 Major depressive disorder, recurrent, unspecified: Secondary | ICD-10-CM | POA: Diagnosis not present

## 2023-07-25 DIAGNOSIS — E1165 Type 2 diabetes mellitus with hyperglycemia: Secondary | ICD-10-CM | POA: Diagnosis not present

## 2023-07-25 DIAGNOSIS — R45851 Suicidal ideations: Secondary | ICD-10-CM | POA: Diagnosis not present

## 2023-07-25 DIAGNOSIS — F319 Bipolar disorder, unspecified: Secondary | ICD-10-CM | POA: Diagnosis not present

## 2023-07-26 DIAGNOSIS — R45851 Suicidal ideations: Secondary | ICD-10-CM | POA: Diagnosis not present

## 2023-07-26 DIAGNOSIS — F319 Bipolar disorder, unspecified: Secondary | ICD-10-CM | POA: Diagnosis not present

## 2023-07-26 DIAGNOSIS — E1165 Type 2 diabetes mellitus with hyperglycemia: Secondary | ICD-10-CM | POA: Diagnosis not present

## 2023-07-27 DIAGNOSIS — Z91148 Patient's other noncompliance with medication regimen for other reason: Secondary | ICD-10-CM | POA: Diagnosis not present

## 2023-07-27 DIAGNOSIS — F17290 Nicotine dependence, other tobacco product, uncomplicated: Secondary | ICD-10-CM | POA: Diagnosis not present

## 2023-07-27 DIAGNOSIS — E1165 Type 2 diabetes mellitus with hyperglycemia: Secondary | ICD-10-CM | POA: Diagnosis not present

## 2023-07-27 DIAGNOSIS — E119 Type 2 diabetes mellitus without complications: Secondary | ICD-10-CM | POA: Diagnosis not present

## 2023-07-27 DIAGNOSIS — F250 Schizoaffective disorder, bipolar type: Secondary | ICD-10-CM | POA: Diagnosis not present

## 2023-07-27 DIAGNOSIS — Z6836 Body mass index (BMI) 36.0-36.9, adult: Secondary | ICD-10-CM | POA: Diagnosis not present

## 2023-07-27 DIAGNOSIS — E785 Hyperlipidemia, unspecified: Secondary | ICD-10-CM | POA: Diagnosis not present

## 2023-07-27 DIAGNOSIS — R52 Pain, unspecified: Secondary | ICD-10-CM | POA: Diagnosis not present

## 2023-07-27 DIAGNOSIS — F319 Bipolar disorder, unspecified: Secondary | ICD-10-CM | POA: Diagnosis not present

## 2023-07-27 DIAGNOSIS — R45851 Suicidal ideations: Secondary | ICD-10-CM | POA: Diagnosis not present

## 2023-07-27 DIAGNOSIS — Z7984 Long term (current) use of oral hypoglycemic drugs: Secondary | ICD-10-CM | POA: Diagnosis not present

## 2023-08-02 DIAGNOSIS — F250 Schizoaffective disorder, bipolar type: Secondary | ICD-10-CM | POA: Diagnosis not present

## 2023-08-05 DIAGNOSIS — F339 Major depressive disorder, recurrent, unspecified: Secondary | ICD-10-CM | POA: Diagnosis not present

## 2023-08-12 DIAGNOSIS — F339 Major depressive disorder, recurrent, unspecified: Secondary | ICD-10-CM | POA: Diagnosis not present

## 2023-08-17 DIAGNOSIS — F339 Major depressive disorder, recurrent, unspecified: Secondary | ICD-10-CM | POA: Diagnosis not present

## 2023-08-24 DIAGNOSIS — F339 Major depressive disorder, recurrent, unspecified: Secondary | ICD-10-CM | POA: Diagnosis not present

## 2023-08-31 DIAGNOSIS — F339 Major depressive disorder, recurrent, unspecified: Secondary | ICD-10-CM | POA: Diagnosis not present

## 2023-09-10 ENCOUNTER — Encounter (HOSPITAL_COMMUNITY): Payer: Self-pay | Admitting: *Deleted

## 2023-09-10 ENCOUNTER — Other Ambulatory Visit: Payer: Self-pay

## 2023-09-10 ENCOUNTER — Emergency Department (HOSPITAL_COMMUNITY): Payer: 59

## 2023-09-10 ENCOUNTER — Emergency Department (HOSPITAL_COMMUNITY)
Admission: EM | Admit: 2023-09-10 | Discharge: 2023-09-10 | Disposition: A | Discharge status: Home / Self Care | Payer: 59 | Attending: Emergency Medicine | Admitting: Emergency Medicine

## 2023-09-10 DIAGNOSIS — M542 Cervicalgia: Secondary | ICD-10-CM | POA: Diagnosis not present

## 2023-09-10 DIAGNOSIS — M79601 Pain in right arm: Secondary | ICD-10-CM | POA: Insufficient documentation

## 2023-09-10 DIAGNOSIS — Y92410 Unspecified street and highway as the place of occurrence of the external cause: Secondary | ICD-10-CM | POA: Insufficient documentation

## 2023-09-10 DIAGNOSIS — R200 Anesthesia of skin: Secondary | ICD-10-CM | POA: Insufficient documentation

## 2023-09-10 LAB — CBG MONITORING, ED: Glucose-Capillary: 414 mg/dL — ABNORMAL HIGH (ref 70–99)

## 2023-09-10 MED ORDER — HYDROCODONE-ACETAMINOPHEN 5-325 MG PO TABS
1.0000 | ORAL_TABLET | Freq: Once | ORAL | Status: AC
  Administered 2023-09-10: 1 via ORAL
  Filled 2023-09-10: qty 1

## 2023-09-10 MED ORDER — IBUPROFEN 400 MG PO TABS
400.0000 mg | ORAL_TABLET | Freq: Once | ORAL | Status: AC
  Administered 2023-09-10: 400 mg via ORAL
  Filled 2023-09-10: qty 1
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
## 2023-09-10 NOTE — ED Notes (Signed)
 Pt to CT
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
## 2023-09-10 NOTE — Care Management (Signed)
Taxi Voucher given to patient to return home
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
## 2023-10-11 ENCOUNTER — Ambulatory Visit: Payer: Self-pay | Admitting: Nurse Practitioner

## 2023-10-11 ENCOUNTER — Other Ambulatory Visit (HOSPITAL_COMMUNITY): Payer: Self-pay

## 2023-10-11 ENCOUNTER — Encounter: Payer: Self-pay | Admitting: Nurse Practitioner

## 2023-10-11 VITALS — BP 137/87 | HR 94 | Temp 98.6°F | Resp 19 | Ht 68.0 in | Wt 239.0 lb

## 2023-10-11 DIAGNOSIS — E1169 Type 2 diabetes mellitus with other specified complication: Secondary | ICD-10-CM

## 2023-10-11 DIAGNOSIS — Z5902 Unsheltered homelessness: Secondary | ICD-10-CM

## 2023-10-11 LAB — GLUCOSE, POCT (MANUAL RESULT ENTRY): POC Glucose: 426 mg/dL — AB (ref 70–99)

## 2023-10-11 MED ORDER — METFORMIN HCL 500 MG PO TABS
500.0000 mg | ORAL_TABLET | Freq: Two times a day (BID) | ORAL | 1 refills | Status: DC
  Filled 2023-10-11: qty 60, 30d supply, fill #0

## 2023-10-11 NOTE — Progress Notes (Signed)
 [place]. Pt newly homeless, has been without metformin or insulin for 15 days. Pt stayed in overflow at [facility] but hopes to get a bed tomorrow. Pt instructed to follow up with [facility] and follow up with provider regarding missing meds. Per order from S. Laurell Roof, NP,  call in Metformin to [facility] community clinic until pt has access to refrigeration for insulin. Pt also has blisters on feet from walking in boots without socks. Donated socks and tennis shoes given as well as glucometer.

## 2023-10-12 ENCOUNTER — Ambulatory Visit: Payer: 59 | Admitting: Internal Medicine
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
## 2023-10-13 NOTE — Progress Notes (Signed)
 Office Visit  Subjective   Patient ID: Leon Ross   DOB: 08-28-1970   Age: 53 y.o.   MRN: [id number]   Chief Complaint Request for diabetes medication refill.     History of Present Illness Leon Ross came to clinic to have blood sugar checked. When Leon Ross saw that the FSBS was over 400 Leon Ross asked if we could refill the metformin until Leon Ross can find a place to stay where Leon Ross can store his glargine properly. Leon Ross was evicted from his sisters house who kept many of his belongings including all of his medications. Leon Ross is attempting to get a bed at [facility]     Past Medical History Past Medical History:  Diagnosis Date   Allergy    Anxiety    Arthritis    Asthma    Depression    Diabetes (HCC)    Erectile dysfunction    Gastroparesis    GERD (gastroesophageal reflux disease)    Hyperlipidemia    Hypertension    Obesity    Schizoaffective disorder (HCC)    Sleep apnea      Allergies Allergies  Allergen Reactions   Lithium Nausea And Vomiting   Benztropine Other (See Comments)    "Makes me randomly fall asleep" and difficulty breathing   Depakote [Divalproex Sodium] Hives and Other (See Comments)    Tremors     Medications  Current Outpatient Medications:    acetaminophen (TYLENOL) 500 MG tablet, Take 2 tablets (1,000 mg total) by mouth every 6 (six) hours as needed for mild pain or moderate pain. (Patient not taking: Reported on 10/11/2023), Disp: , Rfl:    atorvastatin (LIPITOR) 40 MG tablet, Take 40 mg by mouth at bedtime. (Patient not taking: Reported on 10/11/2023), Disp: , Rfl:    bacitracin ointment, Apply topically daily. To abrasions (Patient not taking: Reported on 10/11/2023), Disp: 120 g, Rfl: 0   dapagliflozin propanediol (FARXIGA) 10 MG TABS tablet, Take 10 mg by mouth daily. (Patient not taking: Reported on 10/11/2023), Disp: , Rfl:    docusate sodium (COLACE) 100 MG capsule, Take 1 capsule (100 mg total) by mouth 2 (two) times daily as needed for mild constipation  or moderate constipation. (Patient not taking: Reported on 10/11/2023), Disp: 10 capsule, Rfl: 0   famotidine (PEPCID) 20 MG tablet, Take 20 mg by mouth daily. (Patient not taking: Reported on 10/11/2023), Disp: , Rfl:    gabapentin (NEURONTIN) 300 MG capsule, Take 300 mg by mouth 3 (three) times daily. (Patient not taking: Reported on 10/11/2023), Disp: , Rfl:    glyBURIDE (DIABETA) 5 MG tablet, Take 5 mg by mouth 2 (two) times daily. (Patient not taking: Reported on 10/11/2023), Disp: , Rfl:    hydrOXYzine (ATARAX/VISTARIL) 25 MG tablet, Take 1 tablet (25 mg total) by mouth at bedtime. (Patient not taking: Reported on 10/11/2023), Disp: 30 tablet, Rfl: 0   Insulin Glargine (BASAGLAR KWIKPEN) 100 UNIT/ML, Inject 20 Units into the skin 3 (three) times daily. (Patient not taking: Reported on 10/11/2023), Disp: , Rfl:    lisinopril (ZESTRIL) 10 MG tablet, Take 10 mg by mouth daily. (Patient not taking: Reported on 10/11/2023), Disp: , Rfl:    metFORMIN (GLUCOPHAGE) 500 MG tablet, Take 1 tablet (500 mg total) by mouth 2 (two) times daily., Disp: 60 tablet, Rfl: 1   metFORMIN (GLUCOPHAGE-XR) 500 MG 24 hr tablet, Take 500 mg by mouth 2 (two) times daily. (Patient not taking: Reported on 10/11/2023), Disp: , Rfl:    mirtazapine (REMERON) 15  MG tablet, Take 15 mg by mouth at bedtime. (Patient not taking: Reported on 10/11/2023), Disp: , Rfl:    omeprazole (PRILOSEC) 40 MG capsule, Take 40 mg by mouth daily as needed (for acid reflux). (Patient not taking: Reported on 10/11/2023), Disp: , Rfl:    ondansetron (ZOFRAN) 8 MG tablet, TAKE 1 TABLET BY MOUTH EVERY 8 HOURS AS NEEDED FOR NAUSEA FOR VOMITING (Patient not taking: Reported on 10/11/2023), Disp: 60 tablet, Rfl: 0   paliperidone (INVEGA) 9 MG 24 hr tablet, Take 9 mg by mouth every morning. (Patient not taking: Reported on 10/11/2023), Disp: , Rfl:    prazosin (MINIPRESS) 5 MG capsule, Take 5 mg by mouth at bedtime. (Patient not taking: Reported on 10/11/2023), Disp: , Rfl:     traZODone (DESYREL) 150 MG tablet, Take 1 tablet (150 mg total) by mouth at bedtime. (Patient not taking: Reported on 10/11/2023), Disp: 30 tablet, Rfl: 0   Review of Systems Review of Systems  Constitutional: Negative.   HENT: Negative.    Eyes: Negative.   Cardiovascular: Negative.   Gastrointestinal: Negative.   Genitourinary: Negative.   Musculoskeletal: Negative.   Skin: Negative.        Pt has a blister on left heel from walking in new boots  Neurological: Negative.   Endo/Heme/Allergies:  Positive for polydipsia.  Psychiatric/Behavioral: Negative.        Objective:    Vitals BP 137/87   Pulse 94   Temp 98.6 F (37 C) (Oral)   Resp 19   Ht 5\' 8"  (1.727 m)   Wt 239 lb (108.4 kg)   SpO2 98%   BMI 36.34 kg/m    Physical Examination Physical Exam Constitutional:      Appearance: Normal appearance. Leon Ross is obese.  Eyes:     Pupils: Pupils are equal, round, and reactive to light.  Cardiovascular:     Heart sounds: Normal heart sounds.  Pulmonary:     Effort: Pulmonary effort is normal.     Breath sounds: Normal breath sounds.  Abdominal:     Palpations: Abdomen is soft.     Tenderness: There is no right CVA tenderness or left CVA tenderness.  Skin:    Comments: 1 x 1 cm ruptured vesicle present left heel draining small amount of serous fluid. No s/s of infection.   Neurological:     Mental Status: Leon Ross is alert.       Assessment & Plan:   Type 2 diabetes. Pt FSBS over 400. Leon Ross declined to go to ED. Metformin 500 mg two times per day refilled. Pt states Leon Ross will go to appointment on 3/25 to address other health issues. Leon Ross hopes to have a place where Leon Ross can store refrigerated medications soon. Pt asked to follow up next week with mobile health.       Kittie Plater, NP

## 2023-10-18 ENCOUNTER — Ambulatory Visit: Payer: Self-pay | Admitting: Internal Medicine

## 2023-10-18 ENCOUNTER — Encounter: Payer: Self-pay | Admitting: Internal Medicine

## 2023-10-18 ENCOUNTER — Other Ambulatory Visit (HOSPITAL_COMMUNITY): Payer: Self-pay

## 2023-10-18 VITALS — BP 115/75 | HR 112 | Temp 98.4°F | Resp 16 | Wt 239.0 lb

## 2023-10-18 DIAGNOSIS — S91302A Unspecified open wound, left foot, initial encounter: Secondary | ICD-10-CM

## 2023-10-18 HISTORY — DX: Unspecified open wound, left foot, initial encounter: S91.302A

## 2023-10-18 LAB — GLUCOSE, POCT (MANUAL RESULT ENTRY): POC Glucose: 531 mg/dL — AB (ref 70–99)

## 2023-10-18 MED ORDER — METFORMIN HCL 500 MG PO TABS
1000.0000 mg | ORAL_TABLET | Freq: Two times a day (BID) | ORAL | 1 refills | Status: DC
  Filled 2023-10-18: qty 60, 15d supply, fill #0

## 2023-10-18 NOTE — Progress Notes (Signed)
 Returning pt. Pt given glucometer last week. States CBGs have been in the 200s. Started Metformin 500mg  bid, CBG today was 531. Kandice Moos, NP increased dosage of Metformin. Pt sores on feet improving. Pt and family working on housing. Bus pass given for transport to [facility].

## 2023-10-18 NOTE — Patient Instructions (Signed)
 Recommend ED visit for high glucose.  Increase Metformin dose to 1000 mg twice a day.  Guiafenesin 500 mg every 4 hours as needed. Cough drops as needed.

## 2023-10-18 NOTE — Progress Notes (Signed)
 Acute Office Visit  Subjective:  Presents at [facility] for follow up on diabetes and foot wound check and cough.   Patient ID: Leon Ross, male    DOB: 1970/09/30, 53 y.o.   MRN: [id number]  Chief Complaint  Patient presents with   Cough   Foot Pain    Cough Pertinent negatives include no chills, fever, hemoptysis, sore throat, shortness of breath, weight loss or wheezing.  Foot Pain Associated symptoms include congestion and coughing. Pertinent negatives include no chills, diaphoresis, fever or sore throat.   Patient is in today for diabetic foot wound check on foot. He had worn boots without shoes. This is a follow up from visit last week. He also presents with a cough, dry at night.  Homeless, sleeping during white flag days at [facility] in a chair. Has been checking glucose and it runs in the 200s.  Today had pancakes with syrup and OJ for breakfast.   Review of Systems  Constitutional:  Negative for chills, diaphoresis, fever, malaise/fatigue and weight loss.  HENT:  Positive for congestion. Negative for sore throat.   Eyes: Negative.   Respiratory:  Positive for cough. Negative for hemoptysis, sputum production, shortness of breath and wheezing.   Cardiovascular: Negative.   Gastrointestinal: Negative.   Genitourinary: Negative.   Musculoskeletal: Negative.   Skin:        Wound posterior heel healing, 1 cm, with mild erythema around it. Healed lateral malleolus wound with scab.   Neurological: Negative.   Endo/Heme/Allergies: Negative.   Psychiatric/Behavioral: Negative.        Objective:    BP 115/75   Pulse (!) 112   Temp 98.4 F (36.9 C) (Oral)   Resp 16   Wt 239 lb (108.4 kg)   SpO2 97%   BMI 36.34 kg/m    Physical Exam Constitutional:      General: He is not in acute distress.    Appearance: He is obese. He is not ill-appearing, toxic-appearing or diaphoretic.  HENT:     Head: Normocephalic and atraumatic.      Mouth/Throat:     Mouth: Mucous membranes are moist.     Pharynx: Oropharynx is clear.  Cardiovascular:     Rate and Rhythm: Regular rhythm. Tachycardia present.     Pulses: Normal pulses.     Heart sounds: Normal heart sounds.  Pulmonary:     Effort: Pulmonary effort is normal.     Breath sounds: Normal breath sounds.  Skin:    General: Skin is warm and dry.     Capillary Refill: Capillary refill takes less than 2 seconds.  Neurological:     General: No focal deficit present.     Mental Status: He is alert and oriented to person, place, and time.  Psychiatric:        Mood and Affect: Mood normal.        Behavior: Behavior normal.        Thought Content: Thought content normal.        Judgment: Judgment normal.     Results for orders placed or performed in visit on 10/18/23  POCT glucose (manual entry)  Result Value Ref Range   POC Glucose 531 (A) 70 - 99 mg/dl        Assessment & Plan:   Problem List Items Addressed This Visit   None  Patient Active Problem List   Diagnosis Date Noted   Open wound of left foot excluding one or  more toes 10/18/2023   Rib fractures 12/24/2022   Multiple fractures of ribs, right side, init for clos fx 12/22/2022   Type 2 diabetes mellitus with obesity (HCC) 05/23/2021   Dyslipidemia 05/23/2021   History of posttraumatic stress disorder (PTSD) 05/23/2021   GERD (gastroesophageal reflux disease) 05/23/2021   Schizoaffective disorder, bipolar type (HCC) 05/22/2021    Diabetes with Hyperglycemia- Refused to go to ED. He states if he feels worse he will go.  Instructed to increase dose of Metformin to 1000 mg daily. He is living on the street and using insulin he says is not an option right now. He has a glucometer and has been in the 200's when checking.  Provided with water to hydrate and dilute  glucose level.  Cough without wheezing- Guaifenesin 500 mg  OTC every 4 hours up to 6 a day.  Provided.  Wound left foot- continue with wound  care twice a day. Clean socks provided.   Instructed to go to ER if feeling worse, instructions to companion with him.  Return to [facility] next Tuesday when at [facility].    Penelope Coop, NP

## 2023-10-19 ENCOUNTER — Emergency Department (HOSPITAL_COMMUNITY)
Admission: EM | Admit: 2023-10-19 | Discharge: 2023-10-20 | Disposition: A | Discharge status: Home / Self Care | Payer: MEDICAID | Attending: Emergency Medicine | Admitting: Emergency Medicine

## 2023-10-19 ENCOUNTER — Other Ambulatory Visit: Payer: Self-pay

## 2023-10-19 ENCOUNTER — Encounter (HOSPITAL_COMMUNITY): Payer: Self-pay | Admitting: Emergency Medicine

## 2023-10-19 DIAGNOSIS — R739 Hyperglycemia, unspecified: Secondary | ICD-10-CM

## 2023-10-19 DIAGNOSIS — Z7984 Long term (current) use of oral hypoglycemic drugs: Secondary | ICD-10-CM | POA: Insufficient documentation

## 2023-10-19 DIAGNOSIS — R000 Tachycardia, unspecified: Secondary | ICD-10-CM | POA: Insufficient documentation

## 2023-10-19 DIAGNOSIS — R200 Anesthesia of skin: Secondary | ICD-10-CM | POA: Insufficient documentation

## 2023-10-19 DIAGNOSIS — E1165 Type 2 diabetes mellitus with hyperglycemia: Secondary | ICD-10-CM | POA: Diagnosis not present

## 2023-10-19 DIAGNOSIS — Z5900 Homelessness unspecified: Secondary | ICD-10-CM | POA: Diagnosis not present

## 2023-10-19 LAB — CBG MONITORING, ED: Glucose-Capillary: 521 mg/dL (ref 70–99)

## 2023-10-19 MED ORDER — LACTATED RINGERS IV BOLUS
1000.0000 mL | Freq: Once | INTRAVENOUS | Status: AC
  Administered 2023-10-20: 1000 mL via INTRAVENOUS

## 2023-10-19 NOTE — ED Triage Notes (Signed)
 Patient reports being homeless for 3 weeks, and has been out of diabetes medications for that long.  Patient received glucometer yesterday from [facility] mobile clinic and states that his CBG was 532 tonight.

## 2023-10-20 ENCOUNTER — Other Ambulatory Visit (HOSPITAL_COMMUNITY): Payer: Self-pay

## 2023-10-20 LAB — CBC WITH DIFFERENTIAL/PLATELET
Abs Immature Granulocytes: 0.04 10*3/uL (ref 0.00–0.07)
Basophils Absolute: 0 10*3/uL (ref 0.0–0.1)
Basophils Relative: 0 %
Eosinophils Absolute: 0.1 10*3/uL (ref 0.0–0.5)
Eosinophils Relative: 1 %
HCT: 41.9 % (ref 39.0–52.0)
Hemoglobin: 13.6 g/dL (ref 13.0–17.0)
Immature Granulocytes: 0 %
Lymphocytes Relative: 11 %
Lymphs Abs: 1.4 10*3/uL (ref 0.7–4.0)
MCH: 26.2 pg (ref 26.0–34.0)
MCHC: 32.5 g/dL (ref 30.0–36.0)
MCV: 80.7 fL (ref 80.0–100.0)
Monocytes Absolute: 0.8 10*3/uL (ref 0.1–1.0)
Monocytes Relative: 7 %
Neutro Abs: 10.1 10*3/uL — ABNORMAL HIGH (ref 1.7–7.7)
Neutrophils Relative %: 81 %
Platelets: 282 10*3/uL (ref 150–400)
RBC: 5.19 MIL/uL (ref 4.22–5.81)
RDW: 14 % (ref 11.5–15.5)
WBC: 12.5 10*3/uL — ABNORMAL HIGH (ref 4.0–10.5)
nRBC: 0 % (ref 0.0–0.2)

## 2023-10-20 LAB — BASIC METABOLIC PANEL
Anion gap: 10 (ref 5–15)
BUN: 9 mg/dL (ref 6–20)
CO2: 23 mmol/L (ref 22–32)
Calcium: 9 mg/dL (ref 8.9–10.3)
Chloride: 104 mmol/L (ref 98–111)
Creatinine, Ser: 0.73 mg/dL (ref 0.61–1.24)
GFR, Estimated: >60 mL/min (ref >60)
Glucose, Bld: 275 mg/dL — ABNORMAL HIGH (ref 70–99)
Potassium: 3.5 mmol/L (ref 3.5–5.1)
Sodium: 137 mmol/L (ref 135–145)

## 2023-10-20 LAB — I-STAT CHEM 8, ED
BUN: 9 mg/dL (ref 6–20)
Calcium, Ion: 1.19 mmol/L (ref 1.15–1.40)
Chloride: 97 mmol/L — ABNORMAL LOW (ref 98–111)
Creatinine, Ser: 1 mg/dL (ref 0.61–1.24)
Glucose, Bld: 585 mg/dL (ref 70–99)
HCT: 41 % (ref 39.0–52.0)
Hemoglobin: 13.9 g/dL (ref 13.0–17.0)
Potassium: 4 mmol/L (ref 3.5–5.1)
Sodium: 134 mmol/L — ABNORMAL LOW (ref 135–145)
TCO2: 23 mmol/L (ref 22–32)

## 2023-10-20 LAB — COMPREHENSIVE METABOLIC PANEL
ALT: 19 U/L (ref 0–44)
AST: 20 U/L (ref 15–41)
Albumin: 3.6 g/dL (ref 3.5–5.0)
Alkaline Phosphatase: 95 U/L (ref 38–126)
Anion gap: 10 (ref 5–15)
BUN: 10 mg/dL (ref 6–20)
CO2: 23 mmol/L (ref 22–32)
Calcium: 9.1 mg/dL (ref 8.9–10.3)
Chloride: 98 mmol/L (ref 98–111)
Creatinine, Ser: 1.1 mg/dL (ref 0.61–1.24)
GFR, Estimated: >60 mL/min (ref >60)
Glucose, Bld: 590 mg/dL (ref 70–99)
Potassium: 3.9 mmol/L (ref 3.5–5.1)
Sodium: 131 mmol/L — ABNORMAL LOW (ref 135–145)
Total Bilirubin: 1.2 mg/dL (ref 0.0–1.2)
Total Protein: 6.3 g/dL — ABNORMAL LOW (ref 6.5–8.1)

## 2023-10-20 LAB — URINALYSIS, ROUTINE W REFLEX MICROSCOPIC
Bacteria, UA: NONE SEEN
Bilirubin Urine: NEGATIVE
Glucose, UA: >=500 mg/dL — AB
Hgb urine dipstick: NEGATIVE
Ketones, ur: NEGATIVE mg/dL
Leukocytes,Ua: NEGATIVE
Nitrite: NEGATIVE
Protein, ur: NEGATIVE mg/dL
Specific Gravity, Urine: 1.025 (ref 1.005–1.030)
pH: 6 (ref 5.0–8.0)

## 2023-10-20 LAB — BETA-HYDROXYBUTYRIC ACID: Beta-Hydroxybutyric Acid: 0.23 mmol/L (ref 0.05–0.27)

## 2023-10-20 LAB — TROPONIN I (HIGH SENSITIVITY)
Troponin I (High Sensitivity): 3 ng/L (ref <18)
Troponin I (High Sensitivity): 3 ng/L (ref <18)

## 2023-10-20 LAB — CBG MONITORING, ED
Glucose-Capillary: 375 mg/dL — ABNORMAL HIGH (ref 70–99)
Glucose-Capillary: 269 mg/dL — ABNORMAL HIGH (ref 70–99)

## 2023-10-20 LAB — LACTIC ACID, PLASMA
Lactic Acid, Venous: 4.6 mmol/L (ref 0.5–1.9)
Lactic Acid, Venous: 2.2 mmol/L (ref 0.5–1.9)

## 2023-10-20 MED ORDER — METFORMIN HCL 1000 MG PO TABS
1000.0000 mg | ORAL_TABLET | Freq: Two times a day (BID) | ORAL | 0 refills | Status: DC
  Filled 2023-10-20: qty 60, 30d supply, fill #0

## 2023-10-20 MED ORDER — INSULIN ASPART 100 UNIT/ML IJ SOLN
10.0000 [IU] | Freq: Once | INTRAMUSCULAR | Status: AC
  Administered 2023-10-20: 10 [IU] via SUBCUTANEOUS
  Filled 2023-10-20: qty 0.1

## 2023-10-20 MED ORDER — LACTATED RINGERS IV BOLUS
1000.0000 mL | Freq: Once | INTRAVENOUS | Status: AC
  Administered 2023-10-20: 1000 mL via INTRAVENOUS

## 2023-10-20 MED ORDER — METFORMIN HCL 500 MG PO TABS
1000.0000 mg | ORAL_TABLET | Freq: Two times a day (BID) | ORAL | 0 refills | Status: DC
  Filled 2023-10-20: qty 60, 15d supply, fill #0

## 2023-10-20 MED ORDER — INSULIN ASPART 100 UNIT/ML IJ SOLN
5.0000 [IU] | Freq: Once | INTRAMUSCULAR | Status: DC
  Filled 2023-10-20: qty 0.05

## 2023-10-20 NOTE — Discharge Planning (Signed)
 RNCM consulted in regards to medication assistance.  Pt has Medicaid insurance coverage and is not eligible for Medication Assistance Through [facility]) program. RNCM suggests sending Rx to [facility] at  [address]  to fill and bring to pt at bedside prior to discharge. No further CM needs communicated at this time.

## 2023-10-20 NOTE — Discharge Instructions (Addendum)
 Take your metformin as prescribed.  Check your blood sugar twice daily.  Follow-up with your doctor for further adjustments of your diabetes regimen.  Return to the ED with new or worsening symptoms.

## 2023-10-20 NOTE — ED Notes (Signed)
 Patient ambulated to bathroom.

## 2023-10-20 NOTE — ED Provider Notes (Signed)
 [place] EMERGENCY DEPARTMENT AT [facility] Provider Note   CSN: [id number] Arrival date & time: 10/19/23  2347     History  Chief Complaint  Patient presents with   Hyperglycemia    Leon Ross is a 53 y.o. male.  Patient with a history of diabetes, hyperlipidemia, schizoaffective disorder, GERD, anxiety presents with hyperglycemia.  States he has been homeless for the past 3 weeks and unable to take his diabetes medications which include metformin as well as long and short acting insulin.  He was seen at a mobile van today and found to have a blood sugar of 532.  They gave him a prescription for metformin which she has not yet filled.  He came in tonight because he is been experiencing like some lightheadedness for the past several hours and was concerned about urinating frequently and feeling nauseous and lightheaded.  He has diffuse numbness to his right arm which she has been there for the past 3 weeks and is wondering if he has a pinched nerve.  Denies any injury.  Denies any weakness in the arm.  No chest pain or shortness of breath.  No abdominal pain, nausea or vomiting.  No room spinning dizziness.  No vomiting.  Has had polyuria and urinary frequency and urgency.  He became concerned tonight because he felt lightheaded and his blood sugar was over 500.  States he has been in the 2-300 range for the past several weeks.  The history is provided by the patient.  Hyperglycemia Associated symptoms: fatigue   Associated symptoms: no abdominal pain, no chest pain, no dizziness, no dysuria, no fever, no nausea, no shortness of breath, no vomiting and no weakness        Home Medications Prior to Admission medications   Medication Sig Start Date End Date Taking? Authorizing Provider  acetaminophen (TYLENOL) 500 MG tablet Take 2 tablets (1,000 mg total) by mouth every 6 (six) hours as needed for mild pain or moderate pain. Patient not taking: Reported on 10/11/2023  12/25/22   Eric Form, PA-C  atorvastatin (LIPITOR) 40 MG tablet Take 40 mg by mouth at bedtime. Patient not taking: Reported on 10/11/2023 10/14/22   [provider]  bacitracin ointment Apply topically daily. To abrasions Patient not taking: Reported on 10/11/2023 12/26/22   Eric Form, PA-C  dapagliflozin propanediol (FARXIGA) 10 MG TABS tablet Take 10 mg by mouth daily. Patient not taking: Reported on 10/11/2023    [provider]  docusate sodium (COLACE) 100 MG capsule Take 1 capsule (100 mg total) by mouth 2 (two) times daily as needed for mild constipation or moderate constipation. Patient not taking: Reported on 10/11/2023 12/25/22   Eric Form, PA-C  famotidine (PEPCID) 20 MG tablet Take 20 mg by mouth daily. Patient not taking: Reported on 10/11/2023    [provider]  gabapentin (NEURONTIN) 300 MG capsule Take 300 mg by mouth 3 (three) times daily. Patient not taking: Reported on 10/11/2023    [provider]  glyBURIDE (DIABETA) 5 MG tablet Take 5 mg by mouth 2 (two) times daily. Patient not taking: Reported on 10/11/2023 04/03/21   [provider]  hydrOXYzine (ATARAX/VISTARIL) 25 MG tablet Take 1 tablet (25 mg total) by mouth at bedtime. Patient not taking: Reported on 10/11/2023 05/29/21   Lamar Sprinkles, MD  Insulin Glargine [facility]) 100 UNIT/ML Inject 20 Units into the skin 3 (three) times daily. Patient not taking: Reported on 10/11/2023 08/20/22  [provider]  lisinopril (ZESTRIL) 10 MG tablet Take 10 mg by mouth daily. Patient not taking: Reported on 10/11/2023 09/01/21   [provider]  metFORMIN (GLUCOPHAGE) 500 MG tablet Take 2 tablets (1,000 mg total) by mouth 2 (two) times daily. 10/18/23   Penelope Coop, NP  mirtazapine (REMERON) 15 MG tablet Take 15 mg by mouth at bedtime. Patient not taking: Reported on 10/11/2023 03/16/21   [provider]  omeprazole (PRILOSEC) 40 MG capsule Take 40  mg by mouth daily as needed (for acid reflux). Patient not taking: Reported on 10/11/2023 04/28/21   [provider]  ondansetron (ZOFRAN) 8 MG tablet TAKE 1 TABLET BY MOUTH EVERY 8 HOURS AS NEEDED FOR NAUSEA FOR VOMITING Patient not taking: Reported on 10/11/2023 02/14/23   Doree Albee, PA-C  paliperidone (INVEGA) 9 MG 24 hr tablet Take 9 mg by mouth every morning. Patient not taking: Reported on 10/11/2023 10/18/22   [provider]  prazosin (MINIPRESS) 5 MG capsule Take 5 mg by mouth at bedtime. Patient not taking: Reported on 10/11/2023    [provider]  traZODone (DESYREL) 150 MG tablet Take 1 tablet (150 mg total) by mouth at bedtime. Patient not taking: Reported on 10/11/2023 05/29/21   Lamar Sprinkles, MD      Allergies    Lithium, Benztropine, and Depakote [divalproex sodium]    Review of Systems   Review of Systems  Constitutional:  Positive for fatigue. Negative for activity change, appetite change and fever.  HENT:  Negative for congestion and rhinorrhea.   Respiratory:  Negative for cough, chest tightness and shortness of breath.   Cardiovascular:  Negative for chest pain.  Gastrointestinal:  Negative for abdominal pain, nausea and vomiting.  Genitourinary:  Negative for dysuria and hematuria.  Musculoskeletal:  Negative for arthralgias and myalgias.  Skin:  Negative for rash.  Neurological:  Positive for light-headedness and numbness. Negative for dizziness and weakness.   all other systems are negative except as noted in the HPI and PMH.    Physical Exam Updated Vital Signs BP 109/70 (BP Location: Left Arm)   Pulse (!) 118   Temp 97.8 F (36.6 C) (Oral)   Resp 17   Wt 108.4 kg   SpO2 98%   BMI 36.34 kg/m  Physical Exam Vitals and nursing note reviewed.  Constitutional:      General: He is not in acute distress.    Appearance: He is well-developed.  HENT:     Head: Normocephalic and atraumatic.     Mouth/Throat:     Pharynx: No  oropharyngeal exudate.  Eyes:     Conjunctiva/sclera: Conjunctivae normal.     Pupils: Pupils are equal, round, and reactive to light.  Neck:     Comments: No meningismus. Cardiovascular:     Rate and Rhythm: Regular rhythm. Tachycardia present.     Heart sounds: Normal heart sounds. No murmur heard. Pulmonary:     Effort: Pulmonary effort is normal. No respiratory distress.     Breath sounds: Normal breath sounds.  Abdominal:     Palpations: Abdomen is soft.     Tenderness: There is no abdominal tenderness. There is no guarding or rebound.  Musculoskeletal:        General: No tenderness. Normal range of motion.     Cervical back: Normal range of motion and neck supple.     Comments: Subjective numbness right arm.  Intact radial pulse and cardinal hand movements.  Equal grip strength  bilaterally  Skin:    General: Skin is warm.  Neurological:     Mental Status: He is alert and oriented to person, place, and time.     Cranial Nerves: No cranial nerve deficit.     Motor: No abnormal muscle tone.     Coordination: Coordination normal.     Comments: CN 2-12 intact, no ataxia on finger to nose, no nystagmus, 5/5 strength throughout, no pronator drift, Romberg negative  No ataxia, no nystagmus on finger to nose  Psychiatric:        Behavior: Behavior normal.     ED Results / Procedures / Treatments   Labs (all labs ordered are listed, but only abnormal results are displayed) Labs Reviewed  LACTIC ACID, PLASMA - Abnormal; Notable for the following components:      Result Value   Lactic Acid, Venous 4.6 (*)    All other components within normal limits  LACTIC ACID, PLASMA - Abnormal; Notable for the following components:   Lactic Acid, Venous 2.2 (*)    All other components within normal limits  CBC WITH DIFFERENTIAL/PLATELET - Abnormal; Notable for the following components:   WBC 12.5 (*)    Neutro Abs 10.1 (*)    All other components within normal limits  COMPREHENSIVE  METABOLIC PANEL - Abnormal; Notable for the following components:   Sodium 131 (*)    Glucose, Bld 590 (*)    Total Protein 6.3 (*)    All other components within normal limits  URINALYSIS, ROUTINE W REFLEX MICROSCOPIC - Abnormal; Notable for the following components:   Color, Urine STRAW (*)    Glucose, UA >=500 (*)    All other components within normal limits  BASIC METABOLIC PANEL - Abnormal; Notable for the following components:   Glucose, Bld 275 (*)    All other components within normal limits  CBG MONITORING, ED - Abnormal; Notable for the following components:   Glucose-Capillary 521 (*)    All other components within normal limits  I-STAT CHEM 8, ED - Abnormal; Notable for the following components:   Sodium 134 (*)    Chloride 97 (*)    Glucose, Bld 585 (*)    All other components within normal limits  CBG MONITORING, ED - Abnormal; Notable for the following components:   Glucose-Capillary 375 (*)    All other components within normal limits  CBG MONITORING, ED - Abnormal; Notable for the following components:   Glucose-Capillary 269 (*)    All other components within normal limits  BETA-HYDROXYBUTYRIC ACID  TROPONIN I (HIGH SENSITIVITY)  TROPONIN I (HIGH SENSITIVITY)    EKG EKG Interpretation Date/Time:  Thursday October 20 2023 07:24:55 EDT Ventricular Rate:  89 PR Interval:  144 QRS Duration:  92 QT Interval:  365 QTC Calculation: 445 R Axis:   76  Text Interpretation: Sinus rhythm No significant change was found Confirmed by Glynn Octave [phone]) on 10/20/2023 7:28:19 AM  Radiology No results found.  Procedures Procedures    Medications Ordered in ED Medications  lactated ringers bolus 1,000 mL (has no administration in time range)    ED Course/ Medical Decision Making/ A&P                                 Medical Decision Making Amount and/or Complexity of Data Reviewed Labs: ordered. Decision-making details documented in ED  Course. Radiology: ordered and independent interpretation performed. Decision-making details documented in  ED Course. ECG/medicine tests: ordered and independent interpretation performed. Decision-making details documented in ED Course.  Risk Prescription drug management.   Patient here with lightheadedness, hyperglycemia, polyuria.  History of diabetes.  On arrival he is tachycardic.  Neurological exam is nonfocal.  Will hydrate, check labs rule out DKA Right arm numbness likely secondary to cervical radiculopathy.  He has no weakness.  No head or neck pain.  No weakness.  Intact distal pulses  Given IV fluids and insulin.  Labs show hyperglycemia without evidence of DKA.  Lactate 4.6.  Will hydrate aggressively.  Hyperglycemia has improved with IV fluids and insulin.  Lactic acidosis has improved to 2.2.  Patient feels improved.   He states he is taking metformin for his diabetes now and does not have the ability to get insulin due to his homeless status and would like a refrigeration.  He states he will follow-up with the mobile health clinic and does not want to speak to the transition of care team for assistance.  No evidence of DKA.  Lactic acidosis is improving.  He is tolerating p.o. and ambulatory. Close follow-up with the mobile health clinic.  He has a glucometer and glucose test strips and declines any prescriptions at this time.  D/w TOC who will attempt to get his metformin from outpatient pharmacy before discharge.   Return precautions discussed         Final Clinical Impression(s) / ED Diagnoses Final diagnoses:  Hyperglycemia    Rx / DC Orders ED Discharge Orders     None         Maleke Feria, Jeannett Senior, MD 10/20/23 [phone]

## 2023-10-20 NOTE — ED Notes (Signed)
 Date and time results received: 10/20/23 6:48 AM  (use smartphrase ".now" to insert current time)  Test: Lactic acid Critical Value: 2.2  Name of Provider Notified: Rancour  Orders Received? Or Actions Taken?: Orders Received - See Orders for details

## 2023-11-08 ENCOUNTER — Ambulatory Visit: Payer: MEDICAID

## 2023-11-08 LAB — GLUCOSE, POCT (MANUAL RESULT ENTRY): POC Glucose: 516 mg/dL — AB (ref 70–99)

## 2023-11-08 NOTE — Progress Notes (Unsigned)
 Returning pt, Pt currently at [facility], Has a case worker that can help him get insulin. Medication list updated and copy given with AVS. Plan to f/u next.

## 2023-11-09 ENCOUNTER — Encounter: Payer: Self-pay | Admitting: Physician Assistant

## 2023-11-09 ENCOUNTER — Other Ambulatory Visit: Payer: Self-pay | Admitting: Critical Care Medicine

## 2023-11-09 ENCOUNTER — Other Ambulatory Visit: Payer: Self-pay

## 2023-11-09 ENCOUNTER — Encounter: Payer: Self-pay | Admitting: Critical Care Medicine

## 2023-11-09 MED ORDER — PALIPERIDONE ER 3 MG PO TB24
3.0000 mg | ORAL_TABLET | Freq: Every day | ORAL | 0 refills | Status: DC
  Filled 2023-11-09: qty 30, 30d supply, fill #0

## 2023-11-09 MED ORDER — GLYBURIDE 5 MG PO TABS
10.0000 mg | ORAL_TABLET | Freq: Two times a day (BID) | ORAL | 2 refills | Status: DC
  Filled 2023-11-09: qty 60, 15d supply, fill #0
  Filled 2023-11-23: qty 60, 15d supply, fill #1
  Filled 2023-12-09: qty 60, 15d supply, fill #2

## 2023-11-09 MED ORDER — HYDROXYZINE PAMOATE 25 MG PO CAPS
25.0000 mg | ORAL_CAPSULE | Freq: Four times a day (QID) | ORAL | 1 refills | Status: DC | PRN
  Filled 2023-11-09: qty 60, 15d supply, fill #0
  Filled 2023-12-09: qty 60, 15d supply, fill #1

## 2023-11-09 MED ORDER — METFORMIN HCL 1000 MG PO TABS
1000.0000 mg | ORAL_TABLET | Freq: Two times a day (BID) | ORAL | 0 refills | Status: DC
  Filled 2023-11-09: qty 180, 90d supply, fill #0

## 2023-11-09 MED ORDER — SERTRALINE HCL 50 MG PO TABS
150.0000 mg | ORAL_TABLET | Freq: Every day | ORAL | 0 refills | Status: DC
  Filled 2023-11-09: qty 90, 30d supply, fill #0

## 2023-11-09 MED ORDER — HEALTHY ACCENTS UNIFINE PENTIP 31G X 6 MM MISC
1.0000 | Freq: Every day | 0 refills | Status: DC
Start: 2023-11-09 — End: 2024-03-04
  Filled 2023-11-09: qty 100, 30d supply, fill #0

## 2023-11-09 MED ORDER — BASAGLAR KWIKPEN 100 UNIT/ML ~~LOC~~ SOPN
50.0000 [IU] | PEN_INJECTOR | Freq: Every day | SUBCUTANEOUS | 0 refills | Status: DC
  Filled 2023-11-09: qty 18, 36d supply, fill #0

## 2023-11-09 NOTE — Progress Notes (Unsigned)
 He and his wife were kicked out of his sister-in-law's house in Feb.  They were in a church facility and then he got a bed.   His wife left him for a drug dealer, just him now.   Had a PCP, Erskine Emery, NP, has not seen her recently.   Has Accuchek meter and strips.   Is out of all meds except metformin 1000 mg bid, recently sent in.   NSAIDs were for R arm/neck issue.  CBG 402, was 514 yesterday.   Meds were renewed and sent to [facility]. Lisinopril not renewed since BP good today.   He feels anxious, is prone to panic attacks, feels depressed.   Sertraline helped him in the past >> renewed, Invega every day.  Hydroxyzine prn, insulin.   He will need labs.   Dr Delford Field will see him at 9:30 on 04/17.   Today's Vitals   11/09/23 1438  BP: 112/73  Pulse: (!) 121   There is no height or weight on file to calculate BMI.  Past Medical History:  Diagnosis Date   Allergy    Anxiety    Arthritis    Asthma    Depression    Diabetes (HCC)    Erectile dysfunction    Gastroparesis    GERD (gastroesophageal reflux disease)    Hyperlipidemia    Hypertension    Obesity    Schizoaffective disorder (HCC)    Sleep apnea    Prior to Admission medications   Medication Sig Start Date End Date Taking? Authorizing Provider  acetaminophen (TYLENOL) 500 MG tablet Take 2 tablets (1,000 mg total) by mouth every 6 (six) hours as needed for mild pain or moderate pain. Patient not taking: Reported on 10/11/2023 12/25/22   Eric Form, PA-C  atorvastatin (LIPITOR) 40 MG tablet Take 40 mg by mouth at bedtime. 10/14/22   [provider]  dapagliflozin propanediol (FARXIGA) 10 MG TABS tablet Take 10 mg by mouth daily.    [provider]  docusate sodium (COLACE) 100 MG capsule Take 1 capsule (100 mg total) by mouth 2 (two) times daily as needed for mild constipation or moderate constipation. 12/25/22   Eric Form, PA-C  famotidine (PEPCID) 20 MG tablet Take  20 mg by mouth daily. Patient not taking: Reported on 11/08/2023    [provider]  gabapentin (NEURONTIN) 400 MG capsule Take 400 mg by mouth 3 (three) times daily.    [provider]  glyBURIDE (DIABETA) 5 MG tablet Take 10 mg by mouth 2 (two) times daily. 04/03/21   [provider]  hydrOXYzine (ATARAX/VISTARIL) 25 MG tablet Take 1 tablet (25 mg total) by mouth at bedtime. Patient taking differently: Take 25 mg by mouth every 6 (six) hours as needed for itching or nausea. 05/29/21   Lamar Sprinkles, MD  Insulin Glargine (BASAGLAR KWIKPEN) 100 UNIT/ML Inject 20 Units into the skin 3 (three) times daily. 08/20/22   [provider]  lisinopril (ZESTRIL) 10 MG tablet Take 10 mg by mouth daily. 09/01/21   [provider]  metFORMIN (GLUCOPHAGE) 1000 MG tablet Take 1 tablet (1,000 mg total) by mouth 2 (two) times daily. 10/20/23   Rancour, Jeannett Senior, MD  mirtazapine (REMERON) 15 MG tablet Take 15 mg by mouth at bedtime. 03/16/21   [provider]  omeprazole (PRILOSEC) 40 MG capsule Take 40 mg by mouth daily as needed (for acid reflux). Patient not taking: Reported on 11/08/2023 04/28/21   [provider]  ondansetron (ZOFRAN) 8 MG tablet TAKE 1 TABLET BY MOUTH EVERY 8 HOURS AS NEEDED FOR NAUSEA FOR VOMITING 02/14/23   Doree Albee, PA-C  paliperidone (INVEGA) 3 MG 24 hr tablet Take 3 mg by mouth daily.    [provider]  paliperidone (INVEGA) 9 MG 24 hr tablet Take 9 mg by mouth every morning. 10/18/22   [provider]  prazosin (MINIPRESS) 5 MG capsule Take 5 mg by mouth at bedtime.    [provider]  traZODone (DESYREL) 150 MG tablet Take 1 tablet (150 mg total) by mouth at bedtime. 05/29/21   Lamar Sprinkles, MD    Theodore Demark, PA-C 11/09/2023 2:45 PM

## 2023-11-09 NOTE — Progress Notes (Signed)
 REFILLS

## 2023-11-10 ENCOUNTER — Other Ambulatory Visit: Payer: Self-pay

## 2023-11-10 NOTE — Progress Notes (Signed)
 This is a 53 year old male just arrived last week at the [facility].  The patient was living with his spouse and his sister and brother's house his brother passed away he could not stay with his sister both the spouse and he left then his wife left him he had no money he stayed in a motel over the weekend and then came to a white flag night at the [place] house in February ultimately he was admitted as a shelter resident a week ago.  Patient has significant type 2 diabetes he was seen previously by nurse practitioner Mauricio Po and [facility].  He has been out of all his medications he does have his metformin still 1000 mg twice daily  He was on insulin glargine 20 units 3 times daily hydroxyzine as needed Invega 3 mg tablet daily lisinopril 10 mg daily glipizide 20 mg twice daily sertraline 150 mg daily  He has testing supplies  Recent blood sugar greater than 500 today is 450  His feet are examined and do not have any lesions  His phone number has been verified  Plan will be to obtain for this patient all his medications including the glyburide Invega glipizide and sertraline and hydroxyzine  The insulin and insulin pen needles will be obtained his insulin dose will be changed to 50 units daily  We plan to achieve a visit in my clinic on April 17 at 9:30 AM to establish for primary care

## 2023-11-11 ENCOUNTER — Other Ambulatory Visit: Payer: Self-pay

## 2023-11-14 ENCOUNTER — Ambulatory Visit: Payer: MEDICAID

## 2023-11-14 LAB — GLUCOSE, POCT (MANUAL RESULT ENTRY): POC Glucose: 261 mg/dL — AB (ref 70–99)

## 2023-11-14 NOTE — Progress Notes (Signed)
 Returning pt. Pt now on insulin and glyburide on top of metformin.

## 2023-11-17 ENCOUNTER — Encounter: Payer: Self-pay | Admitting: *Deleted

## 2023-11-17 ENCOUNTER — Other Ambulatory Visit: Payer: Self-pay | Admitting: Physician Assistant

## 2023-11-17 ENCOUNTER — Encounter: Payer: Self-pay | Admitting: Physician Assistant

## 2023-11-17 ENCOUNTER — Other Ambulatory Visit: Payer: Self-pay

## 2023-11-17 MED ORDER — METFORMIN HCL 1000 MG PO TABS
1000.0000 mg | ORAL_TABLET | Freq: Two times a day (BID) | ORAL | 0 refills | Status: AC
  Filled 2023-11-17: qty 180, 90d supply, fill #0

## 2023-11-17 NOTE — Progress Notes (Signed)
 He needs a refill on Metformin.  He saw his PCP recently, got refills on most of his other meds so this is the only one he needs.   Last CBG was 4/09, was 271   He ate about 10 am, now 11:30, CBG 282  He is compliant w/ meds.   Has f/u appt w/ Dr Delford Field at 9:30 on 04/17  Lab Results  Component Value Date   HGBA1C 7.4 (H) 12/23/2022   Has not heard about housing, he is on a waiting list.   Not having any unusual sx or problems (thirst, weakness, etc).  No other issues or concerns.   Theodore Demark, PA-C 11/17/2023 11:37 AM   Theodore Demark, PA-C 11/17/2023 11:36 AM

## 2023-11-17 NOTE — Congregational Nurse Program (Signed)
  Dept: [phone]   Congregational Nurse Program Note  Date of Encounter: 11/17/2023  Past Medical History: Past Medical History:  Diagnosis Date   Allergy    Anxiety    Arthritis    Asthma    Depression    Diabetes (HCC)    Erectile dysfunction    Gastroparesis    GERD (gastroesophageal reflux disease)    Hyperlipidemia    Hypertension    Obesity    Schizoaffective disorder [facility])    Sleep apnea     Encounter Details:  Community Questionnaire - 11/17/23 1445       Questionnaire   Ask client: Do you give verbal consent for me to treat you today? Yes    Student Assistance [employer] Nurse    Location Patient Served  [facility]    Encounter Setting CN site    Population Status Unhoused    Insurance Medicaid    Insurance/Financial Assistance Referral N/A    Medication Have Medication Insecurities;Provided Medication Assistance    Medical Provider Yes    Screening Referrals Made N/A    Medical Referrals Made N/A    Medical Appointment Completed [facility] P[facility]    CNP Interventions Advocate/Support    Screenings CN Performed N/A    ED Visit Averted N/A    Life-Saving Intervention Made N/A           Picked up medication at [facility] pharmacy and brought to [facility]. Reviewed mediation with client and he acknowledges understanding.  Deaaron Fulghum W RN CN

## 2023-11-21 ENCOUNTER — Encounter: Payer: Self-pay | Admitting: *Deleted
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
## 2023-11-21 NOTE — Congregational Nurse Program (Signed)
  Dept: [phone]   Congregational Nurse Program Note  Date of Encounter: 11/21/2023  Past Medical History: Past Medical History:  Diagnosis Date   Allergy    Anxiety    Arthritis    Asthma    Depression    Diabetes (HCC)    Erectile dysfunction    Gastroparesis    GERD (gastroesophageal reflux disease)    Hyperlipidemia    Hypertension    Obesity    Schizoaffective disorder (HCC)    Sleep apnea     Encounter Details:  Community Questionnaire - 11/21/23 1308       Questionnaire   Ask client: Do you give verbal consent for me to treat you today? Yes    Student Assistance N/A    Location Patient Served  [facility]    Encounter Setting CN site    Population Status Unhoused    Insurance Medicaid    Insurance/Financial Assistance Referral N/A    Medication N/A    Medical Provider Yes    Screening Referrals Made N/A    Medical Referrals Made N/A    Medical Appointment Completed N/A    CNP Interventions Advocate/Support    Screenings CN Performed N/A    ED Visit Averted N/A    Life-Saving Intervention Made N/A            Client seen outside of [facility] requesting bus passes for upcoming doctor's appt. Two bus passes given as requested. Leon Ross W RN CN

## 2023-11-22 ENCOUNTER — Encounter: Payer: Self-pay | Admitting: Nurse Practitioner

## 2023-11-22 ENCOUNTER — Ambulatory Visit: Payer: MEDICAID | Admitting: Nurse Practitioner

## 2023-11-22 VITALS — BP 118/76 | HR 110 | Temp 97.8°F | Resp 18 | Ht 68.0 in | Wt 229.0 lb

## 2023-11-22 DIAGNOSIS — E1169 Type 2 diabetes mellitus with other specified complication: Secondary | ICD-10-CM

## 2023-11-22 LAB — GLUCOSE, POCT (MANUAL RESULT ENTRY): POC Glucose: 269 mg/dL — AB (ref 70–99)

## 2023-11-22 NOTE — Progress Notes (Signed)
 Came to [facility] health for glucose recheck. See Dr. Brent Cambric on Thursday. He has been on insulin for about a week.

## 2023-11-23 ENCOUNTER — Encounter: Payer: Self-pay | Admitting: *Deleted

## 2023-11-23 ENCOUNTER — Other Ambulatory Visit: Payer: Self-pay

## 2023-11-23 LAB — GLUCOSE, POCT (MANUAL RESULT ENTRY): POC Glucose: 232 mg/dL — AB (ref 70–99)

## 2023-11-23 NOTE — Congregational Nurse Program (Signed)
  Dept: [phone]   Congregational Nurse Program Note  Date of Encounter: 11/23/2023  Past Medical History: Past Medical History:  Diagnosis Date   Allergy    Anxiety    Arthritis    Asthma    Depression    Diabetes (HCC)    Erectile dysfunction    Gastroparesis    GERD (gastroesophageal reflux disease)    Hyperlipidemia    Hypertension    Obesity    Schizoaffective disorder [facility])    Sleep apnea     Encounter Details:  Community Questionnaire - 11/23/23 1323       Questionnaire   Ask client: Do you give verbal consent for me to treat you today? Yes    Student Assistance N/A    Location Patient Served  [facility]    Encounter Setting CN site;Phone/Text/Email    Population Status Unhoused    Insurance [employer]    Insurance/Financial Assistance Referral N/A    Medication Have Medication Insecurities;Provided Medication Assistance    Medical Provider Yes    Screening Referrals Made N/A    Medical Referrals Made N/A    Medical Appointment Completed N/A    CNP Interventions Advocate/Support;Navigate Healthcare System;Case Management    Screenings CN Performed Blood Pressure    ED Visit Averted N/A    Life-Saving Intervention Made N/A            Client reports he has appt with PCP tomorrow. Checked CBG 232 and reported to MD. Per MD he will go over his medications tomorrow at PCP appt.. Leon Ross W RN CN

## 2023-11-24 ENCOUNTER — Ambulatory Visit: Attending: Critical Care Medicine | Admitting: Critical Care Medicine

## 2023-11-24 ENCOUNTER — Encounter: Payer: Self-pay | Admitting: Critical Care Medicine

## 2023-11-24 ENCOUNTER — Telehealth: Payer: Self-pay

## 2023-11-24 ENCOUNTER — Other Ambulatory Visit: Payer: Self-pay

## 2023-11-24 VITALS — BP 107/72 | Resp 19 | Ht 68.0 in | Wt 231.0 lb

## 2023-11-24 DIAGNOSIS — Z794 Long term (current) use of insulin: Secondary | ICD-10-CM

## 2023-11-24 DIAGNOSIS — Z139 Encounter for screening, unspecified: Secondary | ICD-10-CM

## 2023-11-24 DIAGNOSIS — E119 Type 2 diabetes mellitus without complications: Secondary | ICD-10-CM | POA: Diagnosis not present

## 2023-11-24 DIAGNOSIS — E785 Hyperlipidemia, unspecified: Secondary | ICD-10-CM

## 2023-11-24 DIAGNOSIS — Z7985 Long-term (current) use of injectable non-insulin antidiabetic drugs: Secondary | ICD-10-CM

## 2023-11-24 DIAGNOSIS — S91302S Unspecified open wound, left foot, sequela: Secondary | ICD-10-CM

## 2023-11-24 DIAGNOSIS — E1169 Type 2 diabetes mellitus with other specified complication: Secondary | ICD-10-CM

## 2023-11-24 DIAGNOSIS — Z7984 Long term (current) use of oral hypoglycemic drugs: Secondary | ICD-10-CM

## 2023-11-24 DIAGNOSIS — E669 Obesity, unspecified: Secondary | ICD-10-CM | POA: Diagnosis not present

## 2023-11-24 DIAGNOSIS — E782 Mixed hyperlipidemia: Secondary | ICD-10-CM

## 2023-11-24 DIAGNOSIS — F250 Schizoaffective disorder, bipolar type: Secondary | ICD-10-CM

## 2023-11-24 DIAGNOSIS — D72829 Elevated white blood cell count, unspecified: Secondary | ICD-10-CM

## 2023-11-24 DIAGNOSIS — Z6835 Body mass index (BMI) 35.0-35.9, adult: Secondary | ICD-10-CM

## 2023-11-24 MED ORDER — TRULICITY 0.75 MG/0.5ML ~~LOC~~ SOAJ
0.7500 mg | SUBCUTANEOUS | 4 refills | Status: DC
  Filled 2023-11-24 – 2023-12-16 (×2): qty 2, 28d supply, fill #0

## 2023-11-24 MED ORDER — ATORVASTATIN CALCIUM 10 MG PO TABS
10.0000 mg | ORAL_TABLET | Freq: Every day | ORAL | 3 refills | Status: DC
  Filled 2023-11-24: qty 90, 90d supply, fill #0

## 2023-11-24 NOTE — Assessment & Plan Note (Signed)
 Type 2 diabetes poorly controlled will continue long-acting insulin at 50 units daily begin Trulicity 0.75 mg weekly and continue glyburide and metformin  Full set of labs to be obtained he will follow-up with clinical pharmacist

## 2023-11-24 NOTE — Assessment & Plan Note (Signed)
>>  ASSESSMENT AND PLAN FOR SCHIZOAFFECTIVE DISORDER, BIPOLAR TYPE (HCC) WRITTEN ON 11/24/2023  1:33 PM BY WRIGHT, PATRICK E, MD  Now on medications no changes given referral to [facility] behavioral health

## 2023-11-24 NOTE — Assessment & Plan Note (Signed)
 Continue atorvastatin

## 2023-11-24 NOTE — Assessment & Plan Note (Signed)
 Resolved

## 2023-11-24 NOTE — Assessment & Plan Note (Signed)
 Now on medications no changes given referral to [facility] behavioral health

## 2023-11-24 NOTE — Patient Instructions (Signed)
 Glyburide refilled Begin Trulicity 1 injection weekly will get Medicaid to cover Begin atorvastatin daily for cholesterol  Health screening labs obtained along with other labs and also urine sample  Follow-up for primary care here in 3 months you will see our clinical pharmacist Van Gelinas in 3 weeks for diabetes   Go to [facility] for assessment and potential Invega injections see below [facility] [address] [place], [place] [address] [phone] or [phone] Walk-in urgent care 24/7 for anyone  For [facility] ONLY New patient psychiatry and medication management walk-ins: Mondays, Wednesdays, Thursdays, Fridays 8am-11am No psychiatry walk-ins on Tuesday

## 2023-11-24 NOTE — Telephone Encounter (Signed)
 Pharmacy Patient Advocate Encounter   Received notification from CoverMyMeds that prior authorization for TRULICITY is required/requested.   Insurance verification completed.   The patient is insured through [employer] [place] .   Per test claim: PA required; PA submitted to above mentioned insurance via CoverMyMeds Key/confirmation #/EOC [id number] Status is pending

## 2023-11-25 ENCOUNTER — Other Ambulatory Visit: Payer: Self-pay

## 2023-11-25 ENCOUNTER — Telehealth: Payer: Self-pay

## 2023-11-25 NOTE — Telephone Encounter (Signed)
 Pharmacy Patient Advocate Encounter  Received notification from [facility] that Prior Authorization for TRULICITY  has been APPROVED from 11/24/2023 to 11/23/2024

## 2023-11-25 NOTE — Progress Notes (Signed)
 Pt aware of results and to stay on current medications

## 2023-11-26 LAB — COMPREHENSIVE METABOLIC PANEL WITH GFR
ALT: 17 IU/L (ref 0–44)
AST: 17 IU/L (ref 0–40)
Albumin: 4.3 g/dL (ref 3.8–4.9)
Alkaline Phosphatase: 98 IU/L (ref 44–121)
BUN/Creatinine Ratio: 7 — ABNORMAL LOW (ref 9–20)
BUN: 6 mg/dL (ref 6–24)
Bilirubin Total: 0.4 mg/dL (ref 0.0–1.2)
CO2: 23 mmol/L (ref 20–29)
Calcium: 10.3 mg/dL — ABNORMAL HIGH (ref 8.7–10.2)
Chloride: 101 mmol/L (ref 96–106)
Creatinine, Ser: 0.89 mg/dL (ref 0.76–1.27)
Globulin, Total: 2.1 g/dL (ref 1.5–4.5)
Glucose: 170 mg/dL — ABNORMAL HIGH (ref 70–99)
Potassium: 4.7 mmol/L (ref 3.5–5.2)
Sodium: 141 mmol/L (ref 134–144)
Total Protein: 6.4 g/dL (ref 6.0–8.5)
eGFR: 103 mL/min/{1.73_m2} (ref >59)

## 2023-11-26 LAB — LIPID PANEL
Chol/HDL Ratio: 5.7 ratio — ABNORMAL HIGH (ref 0.0–5.0)
Cholesterol, Total: 222 mg/dL — ABNORMAL HIGH (ref 100–199)
HDL: 39 mg/dL — ABNORMAL LOW (ref >39)
LDL Chol Calc (NIH): 111 mg/dL — ABNORMAL HIGH (ref 0–99)
Triglycerides: 418 mg/dL — ABNORMAL HIGH (ref 0–149)
VLDL Cholesterol Cal: 72 mg/dL — ABNORMAL HIGH (ref 5–40)

## 2023-11-26 LAB — CBC WITH DIFFERENTIAL/PLATELET
Basophils Absolute: 0.1 10*3/uL (ref 0.0–0.2)
Basos: 1 %
EOS (ABSOLUTE): 0.2 10*3/uL (ref 0.0–0.4)
Eos: 2 %
Hematocrit: 48.9 % (ref 37.5–51.0)
Hemoglobin: 16 g/dL (ref 13.0–17.7)
Immature Grans (Abs): 0 10*3/uL (ref 0.0–0.1)
Immature Granulocytes: 0 %
Lymphocytes Absolute: 1.9 10*3/uL (ref 0.7–3.1)
Lymphs: 17 %
MCH: 27.3 pg (ref 26.6–33.0)
MCHC: 32.7 g/dL (ref 31.5–35.7)
MCV: 83 fL (ref 79–97)
Monocytes Absolute: 0.6 10*3/uL (ref 0.1–0.9)
Monocytes: 5 %
Neutrophils Absolute: 8.4 10*3/uL — ABNORMAL HIGH (ref 1.4–7.0)
Neutrophils: 75 %
Platelets: 287 10*3/uL (ref 150–450)
RBC: 5.87 x10E6/uL — ABNORMAL HIGH (ref 4.14–5.80)
RDW: 13.9 % (ref 11.6–15.4)
WBC: 11.3 10*3/uL — ABNORMAL HIGH (ref 3.4–10.8)

## 2023-11-26 LAB — HCV INTERPRETATION

## 2023-11-26 LAB — MICROALBUMIN / CREATININE URINE RATIO
Creatinine, Urine: 104.6 mg/dL
Microalb/Creat Ratio: 4 mg/g{creat} (ref 0–29)
Microalbumin, Urine: 4 ug/mL

## 2023-11-26 LAB — HEMOGLOBIN A1C
Est. average glucose Bld gHb Est-mCnc: 266 mg/dL
Hgb A1c MFr Bld: 10.9 % — ABNORMAL HIGH (ref 4.8–5.6)

## 2023-11-26 LAB — HIV ANTIBODY (ROUTINE TESTING W REFLEX): HIV Screen 4th Generation wRfx: NONREACTIVE

## 2023-11-26 LAB — HCV AB W REFLEX TO QUANT PCR: HCV Ab: NONREACTIVE

## 2023-12-05 ENCOUNTER — Other Ambulatory Visit: Payer: Self-pay

## 2023-12-06 ENCOUNTER — Other Ambulatory Visit: Payer: Self-pay

## 2023-12-09 ENCOUNTER — Other Ambulatory Visit: Payer: Self-pay

## 2023-12-13 ENCOUNTER — Other Ambulatory Visit: Payer: Self-pay | Admitting: Critical Care Medicine

## 2023-12-13 ENCOUNTER — Other Ambulatory Visit: Payer: Self-pay
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
## 2023-12-14 ENCOUNTER — Encounter: Payer: Self-pay | Admitting: Physician Assistant

## 2023-12-14 ENCOUNTER — Other Ambulatory Visit: Payer: Self-pay

## 2023-12-14 ENCOUNTER — Other Ambulatory Visit: Payer: Self-pay | Admitting: Critical Care Medicine

## 2023-12-14 ENCOUNTER — Encounter: Payer: Self-pay | Admitting: *Deleted

## 2023-12-14 MED ORDER — PALIPERIDONE ER 3 MG PO TB24
3.0000 mg | ORAL_TABLET | Freq: Every day | ORAL | 0 refills | Status: DC
  Filled 2023-12-14: qty 30, 30d supply, fill #0

## 2023-12-14 MED ORDER — SERTRALINE HCL 50 MG PO TABS
150.0000 mg | ORAL_TABLET | Freq: Every day | ORAL | 0 refills | Status: DC
  Filled 2023-12-14: qty 180, 60d supply, fill #0

## 2023-12-14 NOTE — Congregational Nurse Program (Signed)
  Dept: [phone]   Congregational Nurse Program Note  Date of Encounter: 12/14/2023  Past Medical History: Past Medical History:  Diagnosis Date   Allergy    Anxiety    Arthritis    Asthma    Depression    Diabetes (HCC)    Erectile dysfunction    Gastroparesis    GERD (gastroesophageal reflux disease)    Hyperlipidemia    Hypertension    Multiple fractures of ribs, right side, init for clos fx 12/22/2022   Obesity    Open wound of left foot excluding one or more toes 10/18/2023   Left lateral mallelous and heel from wearing boots with no socks.     Schizoaffective disorder [facility])    Sleep apnea     Encounter Details:  Community Questionnaire - 12/14/23 1048       Questionnaire   Ask client: Do you give verbal consent for me to treat you today? Yes    Student Assistance N/A    Location Patient Served  [facility]    Encounter Setting CN site    Population Status Unhoused    Insurance Medicaid    Insurance/Financial Assistance Referral N/A    Medication Have Medication Insecurities    Medical Provider Yes    Screening Referrals Made N/A    Medical Referrals Made [facility] PCP/Clinic    Medical Appointment Completed N/A    CNP Interventions Advocate/Support;Case Management    Screenings CN Performed Blood Pressure    ED Visit Averted N/A    Life-Saving Intervention Made N/A            Client requested assistance getting refills on his medications. Contacted [facility] pharmacy about refills and was informed refill declined. Client has not seen PCP listed in epic and is in process of changing PCP. Client reports he has an appointment in July. Completed triage form to see Dr Brent Cambric in [facility] clinic this afternoon to discuss refills on medications.  Cynda Soule W RN CN

## 2023-12-14 NOTE — Progress Notes (Signed)
 Pt here for refill of the Sertraline .  His blood sugars have been running between 120-250.  He had lunch today about 10:30 so this is about 4 hr postprandial. CBG 109  He is hoping to get on Trulicity  and has appt w/ pharmacist on 05/09 at 3 pm, and has new PCP appt on 07/18.  He was given bus passes to pick up med tomorrow.   Not having any suicide thoughts, but stress level is high from being here.   He is on a list for housing, but is #52.  His Medicaid has come through but he does not have income, so the copay was put on account.  He is compliant w/ meds and otherwise doing well      12/14/2023   10:47 AM 12/13/2023   12:21 PM 11/24/2023    9:26 AM  Vitals with BMI  Height   5\' 8"   Weight  240 lbs 231 lbs  BMI  36.5 35.13  Systolic 124 102 540  Diastolic 82 67 72  Pulse 109 100    Hadli Vandemark, PA-C 12/14/2023 2:24 PM

## 2023-12-15 ENCOUNTER — Other Ambulatory Visit: Payer: Self-pay

## 2023-12-16 ENCOUNTER — Other Ambulatory Visit: Payer: Self-pay

## 2023-12-16 ENCOUNTER — Ambulatory Visit: Payer: MEDICAID | Attending: Nurse Practitioner | Admitting: Pharmacist

## 2023-12-16 ENCOUNTER — Encounter: Payer: Self-pay | Admitting: Pharmacist

## 2023-12-16 DIAGNOSIS — E1169 Type 2 diabetes mellitus with other specified complication: Secondary | ICD-10-CM | POA: Diagnosis not present

## 2023-12-16 DIAGNOSIS — Z794 Long term (current) use of insulin: Secondary | ICD-10-CM | POA: Diagnosis not present

## 2023-12-16 DIAGNOSIS — E669 Obesity, unspecified: Secondary | ICD-10-CM | POA: Diagnosis not present

## 2023-12-16 DIAGNOSIS — Z7984 Long term (current) use of oral hypoglycemic drugs: Secondary | ICD-10-CM

## 2023-12-16 NOTE — Progress Notes (Signed)
 S:     No chief complaint on file.  53 y.o. male who presents for diabetes evaluation, education, and management. Patient arrives in good spirits.   Patient was referred and last seen by Primary Care Provider, Dr. Brent Cambric, on 11/24/2023. A1c at that visit was 10.9%. Trulicity  was added to his regimen at that time, but we had to get PA approval. This was subsequently approved on 11/25/2023.   PMH is significant for T2DM, HLD, GERD, PTSD, schizoaffective disorder, and depression. Of note, he has no known hx of clinical ASCVD, CHF, or CKD.   Patient reports Diabetes is longstanding. He does not have any previous hx of pancreatitis. He does have a hx of admission for hyperglycemia at an outside health system.  He has been on his current regimen for some time. Prior to seeing Dr. Brent Cambric last month, he has been on Trulicity , Farxiga, Tresiba , and Humalog before. However, he has experienced significant challenges with his SDOH and insurance coverage resulting in him having to change therapy.   Today, he reports his Hildegard Low is now active. He is able to afford his metformin , glyburide , and Basaglar . He has not started Trulicity  but plans to pick it up today. It appears he is here for counseling before he begins this.    Family/Social History:  -Fhx: no pertinent positives  -Tobacco: current 0.5 PPD smoker  -Alcohol: none reported   Current diabetes medications include: Basaglar  50 units daily, glyburide  10 mg daily BID, metformin  1000 mg BID, Trulicity  0.75 mg weekly (has not started) Current hypertension medications include: none Current hyperlipidemia medications include: atorvastatin  10 mg daily  Patient reports adherence to taking all medications as prescribed.   Insurance coverage: [employer]  Patient denies hypoglycemic events.  Reported home fasting blood sugars: none given  Reported 2 hour post-meal/random blood sugars: none given.  Patient denies nocturia (nighttime urination).   Patient denies neuropathy (nerve pain). Patient denies visual changes. Patient reports self foot exams.   Patient reported dietary habits: none reported   Patient-reported exercise habits: none reported   O:  No CGM or GM present today.   Lab Results  Component Value Date   HGBA1C 10.9 (H) 11/24/2023   There were no vitals filed for this visit.  Lipid Panel     Component Value Date/Time   CHOL 222 (H) 11/24/2023 1002   TRIG 418 (H) 11/24/2023 1002   HDL 39 (L) 11/24/2023 1002   CHOLHDL 5.7 (H) 11/24/2023 1002   CHOLHDL 5.7 05/23/2021 0628   VLDL 56 (H) 05/23/2021 0628   LDLCALC 111 (H) 11/24/2023 1002    Clinical Atherosclerotic Cardiovascular Disease (ASCVD): No  The ASCVD Risk score (Arnett DK, et al., 2019) failed to calculate for the following reasons:   Unable to determine if patient is Non-Hispanic African American   Patient is participating in a Managed Medicaid Plan: No   A/P: Diabetes longstanding currently uncontrolled given A1c. He is amenable to starting Trulicity  today. Covered injection technique with him. Patient is currently asymptomatic from a hyper- or hypoglycemic standpoint but is able to verbalize appropriate hypoglycemia management plan. Medication adherence appears to be okay. -Continued glyburide , metformin , and Basaglar  at current doses.  -Start Trulicity  0.75 mg weekly. Patient was educated on the use of the Trulicity  pen. Reviewed necessary supplies and operation of the pen. -Patient educated on purpose, proper use, and potential adverse effects of Trulicity .  -Extensively discussed pathophysiology of diabetes, recommended lifestyle interventions, dietary effects on blood sugar control.  -Counseled  on s/sx of and management of hypoglycemia.  -Next A1c anticipated 02/2024.   Written patient instructions provided. Patient verbalized understanding of treatment plan.  Total time in face to face counseling 30 minutes.    Follow-up:  Pharmacist in  1 month.   Marene Shape, PharmD, Becky Bowels, CPP Clinical Pharmacist [facility] [phone]

## 2023-12-21 ENCOUNTER — Other Ambulatory Visit: Payer: Self-pay | Admitting: Physician Assistant

## 2023-12-21 ENCOUNTER — Other Ambulatory Visit: Payer: Self-pay

## 2023-12-22 ENCOUNTER — Other Ambulatory Visit: Payer: Self-pay

## 2023-12-22 ENCOUNTER — Other Ambulatory Visit: Payer: Self-pay | Admitting: Emergency Medicine

## 2023-12-22 ENCOUNTER — Other Ambulatory Visit: Payer: Self-pay | Admitting: Critical Care Medicine

## 2023-12-22 MED ORDER — BASAGLAR KWIKPEN 100 UNIT/ML ~~LOC~~ SOPN
50.0000 [IU] | PEN_INJECTOR | Freq: Every day | SUBCUTANEOUS | 0 refills | Status: DC
  Filled 2023-12-22: qty 15, 30d supply, fill #0

## 2023-12-22 NOTE — Progress Notes (Signed)
 refills
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
## 2023-12-23 ENCOUNTER — Other Ambulatory Visit: Payer: Self-pay

## 2024-01-19 ENCOUNTER — Ambulatory Visit: Payer: MEDICAID | Admitting: Pharmacist

## 2024-01-25 NOTE — Patient Instructions (Signed)
 Thank you for choosing [facility] at [facility] for your Primary Care needs. I am excited for the opportunity to partner with you to meet your health care goals. It was a pleasure meeting you today!  Information on diet, exercise, and health maintenance recommendations are listed below. This is information to help you be sure you are on track for optimal health and monitoring.   Please look over this and let us  know if you have any questions or if you have completed any of the health maintenance outside of [facility] so that we can be sure your records are up to date.  ___________________________________________________________  MyChart:  For all urgent or time sensitive needs we ask that you please call the office to avoid delays. Our number is [phone]. MyChart is not constantly monitored and due to the large volume of messages a day, replies may take up to 72 business hours.  MyChart Policy: MyChart allows for you to see your visit notes, after visit summary, provider recommendations, lab and tests results, make an appointment, request refills, and contact your provider or the office for non-urgent questions or concerns. Providers are seeing patients during normal business hours and do not have built in time to review MyChart messages.  We ask that you allow a minimum of 3 business days for responses to [employer]. For this reason, please do not send urgent requests through MyChart. Please call the office at [phone]. New and ongoing conditions may require a visit. We have virtual and in-person visits available for your convenience.  Complex MyChart concerns may require a visit. Your provider may request you schedule a virtual or in-person visit to ensure we are providing the best care possible. MyChart messages sent after 11:00 AM on Friday may not be received by the provider until Monday morning.    Lab and Test Results: You will receive your lab and test  results on MyChart as soon as they are completed and results have been sent by the lab or testing facility. Due to this service, you will receive your results BEFORE your provider.  I review lab and test results each morning prior to seeing patients. Some results require collaboration with other providers to ensure you are receiving the most appropriate care. For this reason, we ask that you please allow a minimum of 3-5 business days from the time that ALL results have been received for your provider to receive and review lab and test results and contact you about these.  Most lab and test result comments from the provider will be sent through MyChart. Your provider may recommend changes to the plan of care, follow-up visits, repeat testing, ask questions, or request an office visit to discuss these results. You may reply directly to this message or call the office to provide information for the provider or set up an appointment. In some instances, you will be called with test results and recommendations. Please let us  know if this is preferred and we will make note of this in your chart to provide this for you.    If you have not heard a response to your lab or test results in 5 business days from all results returning to MyChart, please call the office to let us  know. We ask that you please avoid calling prior to this time unless there is an emergent concern. Due to high call volumes, this can delay the resulting process.  After Hours: For all non-emergency after hours needs, please  call the office at [phone] and select the option to reach the on-call  service. On-call services are shared between multiple [facility] offices and therefore it will not be possible to speak directly with your provider. On-call providers may provide medical advice and recommendations, but are unable to provide refills for maintenance medications.  For all emergency or urgent medical needs after normal business hours, we  recommend that you seek care at the closest Urgent Care or Emergency Department to ensure appropriate treatment in a timely manner.  [facility] has a 24 hour emergency room located on the ground floor for your convenience.   Urgent Concerns During the Business Day Providers are seeing patients from 8AM to 5PM with a busy schedule and are most often not able to respond to non-urgent calls until the end of the day or the next business day. If you should have URGENT concerns during the day, please call and speak to the nurse or schedule a same day appointment so that we can address your concern without delay.   Thank you, again, for choosing me as your health care partner. I appreciate your trust and look forward to learning more about you!   Ica Daye L. Korene Pert, DNP, AGNP-C ___________________________________________________________  Health Maintenance Recommendations Screening Testing Mammogram Every 1-2 years based on history and risk factors Starting at age 7 Pap Smear Ages 21-39 every 3 years Ages 64-65 every 5 years with HPV testing More frequent testing may be required based on results and history Colon Cancer Screening Every 1-10 years based on test performed, risk factors, and history Starting at age 86 Bone Density Screening Every 2-10 years based on history Starting at age 50 for women Recommendations for men differ based on medication usage, history, and risk factors AAA Screening One time ultrasound Men 1-57 years old who have ever smoked Lung Cancer Screening Low Dose Lung CT every 12 months Age 27-80 years with a 20 pack-year smoking history who still smoke or who have quit within the last 15 years  Screening Labs Routine  Labs: Complete Blood Count (CBC), Complete Metabolic Panel (CMP), Cholesterol (Lipid Panel) Every 6-12 months based on history and medications May be recommended more frequently based on current conditions or previous  results Hemoglobin A1c Lab Every 3-12 months based on history and previous results Starting at age 29 or earlier with diagnosis of diabetes, high cholesterol, BMI >26, and/or risk factors Frequent monitoring for patients with diabetes to ensure blood sugar control Thyroid  Panel  Every 6 months based on history, symptoms, and risk factors May be repeated more often if on medication HIV One time testing for all patients 88 and older May be repeated more frequently for patients with increased risk factors or exposure Hepatitis C One time testing for all patients 78 and older May be repeated more frequently for patients with increased risk factors or exposure Gonorrhea, Chlamydia Every 12 months for all sexually active persons 13-24 years Additional monitoring may be recommended for those who are considered high risk or who have symptoms PSA Men 66-41 years old with risk factors Additional screening may be recommended from age 36-69 based on risk factors, symptoms, and history  Vaccine Recommendations Tetanus Booster All adults every 10 years Flu Vaccine All patients 6 months and older every year COVID Vaccine All patients 12 years and older Initial dosing with booster May recommend additional booster based on age and health history HPV Vaccine 2 doses all patients age 35-26 Dosing may be considered for  patients over 26 Shingles Vaccine (Shingrix) 2 doses all adults 50 years and older Pneumonia (Pneumovax 23) All adults 65 years and older May recommend earlier dosing based on health history Pneumonia (Prevnar 18) All adults 65 years and older Dosed 1 year after Pneumovax 23 Pneumonia (Prevnar 20) All adults 65 years and older (adults 19-64 with certain conditions or risk factors) 1 dose  For those who have not received Prevnar 13 vaccine previously   Additional Screening, Testing, and Vaccinations may be recommended on an individualized basis based on family history, health  history, risk factors, and/or exposure.  __________________________________________________________  Diet Recommendations for All Patients  I recommend that all patients maintain a diet low in saturated fats, carbohydrates, and cholesterol. While this can be challenging at first, it is not impossible and small changes can make big differences.  Things to try: Decreasing the amount of soda, sweet tea, and/or juice to one or less per day and replace with water While water is always the first choice, if you do not like water you may consider adding a water additive without sugar to improve the taste other sugar free drinks Replace potatoes with a brightly colored vegetable  Use healthy oils, such as canola oil or olive oil, instead of butter or hard margarine Limit your bread intake to two pieces or less a day Replace regular pasta with low carb pasta options Bake, broil, or grill foods instead of frying Monitor portion sizes  Eat smaller, more frequent meals throughout the day instead of large meals  An important thing to remember is, if you love foods that are not great for your health, you don't have to give them up completely. Instead, allow these foods to be a reward when you have done well. Allowing yourself to still have special treats every once in a while is a nice way to tell yourself thank you for working hard to keep yourself healthy.   Also remember that every day is a new day. If you have a bad day and fall off the wagon, you can still climb right back up and keep moving along on your journey!  We have resources available to help you!  Some websites that may be helpful include: www.http://www.wall-moore.info/  Www.VeryWellFit.com _____________________________________________________________  Activity Recommendations for All Patients  I recommend that all adults get at least 30 minutes of moderate physical activity that elevates your heart rate at least 5 days out of the week.  Some  examples include: Walking or jogging at a pace that allows you to carry on a conversation Cycling (stationary bike or outdoors) Water aerobics Yoga Weight lifting Dancing If physical limitations prevent you from putting stress on your joints, exercise in a pool or seated in a chair are excellent options.  Do determine your MAXIMUM heart rate for activity: 220 - YOUR AGE = MAX Heart Rate   Remember! Do not push yourself too hard.  Start slowly and build up your pace, speed, weight, time in exercise, etc.  Allow your body to rest between exercise and get good sleep. You will need more water than normal when you are exerting yourself. Do not wait until you are thirsty to drink. Drink with a purpose of getting in at least 8, 8 ounce glasses of water a day plus more depending on how much you exercise and sweat.    If you begin to develop dizziness, chest pain, abdominal pain, jaw pain, shortness of breath, headache, vision changes, lightheadedness, or other concerning symptoms, stop  the activity and allow your body to rest. If your symptoms are severe, seek emergency evaluation immediately. If your symptoms are concerning, but not severe, please let us  know so that we can recommend further evaluation.
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
## 2024-01-26 ENCOUNTER — Ambulatory Visit (INDEPENDENT_AMBULATORY_CARE_PROVIDER_SITE_OTHER): Payer: MEDICAID | Admitting: Student

## 2024-01-26 ENCOUNTER — Encounter: Payer: Self-pay | Admitting: Student

## 2024-01-26 ENCOUNTER — Other Ambulatory Visit: Payer: Self-pay

## 2024-01-26 VITALS — BP 122/78 | HR 99 | Temp 99.4°F | Resp 12 | Ht 68.0 in | Wt 241.8 lb

## 2024-01-26 DIAGNOSIS — F250 Schizoaffective disorder, bipolar type: Secondary | ICD-10-CM

## 2024-01-26 DIAGNOSIS — Z5900 Homelessness unspecified: Secondary | ICD-10-CM | POA: Diagnosis not present

## 2024-01-26 DIAGNOSIS — E1169 Type 2 diabetes mellitus with other specified complication: Secondary | ICD-10-CM

## 2024-01-26 DIAGNOSIS — F419 Anxiety disorder, unspecified: Secondary | ICD-10-CM | POA: Diagnosis not present

## 2024-01-26 DIAGNOSIS — Z794 Long term (current) use of insulin: Secondary | ICD-10-CM

## 2024-01-26 DIAGNOSIS — Z7689 Persons encountering health services in other specified circumstances: Secondary | ICD-10-CM | POA: Insufficient documentation

## 2024-01-26 DIAGNOSIS — E669 Obesity, unspecified: Secondary | ICD-10-CM

## 2024-01-26 DIAGNOSIS — Z7985 Long-term (current) use of injectable non-insulin antidiabetic drugs: Secondary | ICD-10-CM

## 2024-01-26 MED ORDER — BASAGLAR KWIKPEN 100 UNIT/ML ~~LOC~~ SOPN
50.0000 [IU] | PEN_INJECTOR | Freq: Every day | SUBCUTANEOUS | 0 refills | Status: DC
  Filled 2024-01-26: qty 18, 36d supply, fill #0

## 2024-01-26 MED ORDER — SERTRALINE HCL 50 MG PO TABS
150.0000 mg | ORAL_TABLET | Freq: Every day | ORAL | 0 refills | Status: DC
  Filled 2024-01-26: qty 180, 60d supply, fill #0

## 2024-01-26 MED ORDER — PALIPERIDONE ER 3 MG PO TB24
3.0000 mg | ORAL_TABLET | Freq: Every day | ORAL | 0 refills | Status: DC
  Filled 2024-01-26: qty 30, 30d supply, fill #0

## 2024-01-26 MED ORDER — TRULICITY 0.75 MG/0.5ML ~~LOC~~ SOAJ
0.7500 mg | SUBCUTANEOUS | 1 refills | Status: DC
  Filled 2024-01-26: qty 2, 28d supply, fill #0

## 2024-01-26 MED ORDER — GLYBURIDE 5 MG PO TABS
10.0000 mg | ORAL_TABLET | Freq: Two times a day (BID) | ORAL | 2 refills | Status: AC
  Filled 2024-01-26: qty 60, 15d supply, fill #0

## 2024-01-26 MED ORDER — HYDROXYZINE PAMOATE 25 MG PO CAPS
25.0000 mg | ORAL_CAPSULE | Freq: Four times a day (QID) | ORAL | 1 refills | Status: DC | PRN
  Filled 2024-01-26: qty 60, 15d supply, fill #0

## 2024-01-26 NOTE — Assessment & Plan Note (Addendum)
 Type 2 diabetes ,poorly controlled. Currently on long-acting insulin  at 50 units, Trulicity  0.75 mg weekly,  glyburide  and metformin .  Refills provided . Referral sent to endocrinology for further evaluation.  Provided Pt education r/t morning fasting blood sugar and titrating long-acting insulin  based on fasting AM blood sugar. Goal AM Fasting BS between 80-130.  Directions for titration provided to patient in AVS. Patient voices understanding and had no further questions

## 2024-01-26 NOTE — Assessment & Plan Note (Signed)
 Social work referral placed for [employer].  States he is working on getting a new phone his recently broke.  He will follow-up with me in 1 months for CPE and we will discuss release [facility] referral.

## 2024-01-26 NOTE — Assessment & Plan Note (Signed)
>>  ASSESSMENT AND PLAN FOR SCHIZOAFFECTIVE DISORDER, BIPOLAR TYPE (HCC) WRITTEN ON 01/26/2024  5:00 PM BY Decorey Wahlert L, NP  Stable on Paliperidone  (Invega ).  Refill provided today. Given referral to [facility] behavioral health for medication management.

## 2024-01-26 NOTE — Assessment & Plan Note (Addendum)
 Stable on current medications Zoloft  daily and hydroxyzine  as needed. refills provided.

## 2024-01-26 NOTE — Assessment & Plan Note (Signed)
 Stable on Paliperidone  (Invega ).  Refill provided today. Given referral to [facility] behavioral health for medication management.

## 2024-01-27 ENCOUNTER — Other Ambulatory Visit: Payer: Self-pay

## 2024-01-27 NOTE — Addendum Note (Signed)
 Addended by: Jackqueline Mason on: 01/27/2024 05:05 PM   Modules accepted: Orders, Level of Service

## 2024-01-30 ENCOUNTER — Other Ambulatory Visit: Payer: Self-pay

## 2024-01-31 ENCOUNTER — Other Ambulatory Visit: Payer: Self-pay

## 2024-01-31 ENCOUNTER — Encounter: Payer: Self-pay | Admitting: Family Medicine

## 2024-01-31 ENCOUNTER — Ambulatory Visit: Payer: MEDICAID | Admitting: Family Medicine

## 2024-01-31 VITALS — BP 143/81 | HR 117 | Temp 97.6°F | Resp 20

## 2024-01-31 DIAGNOSIS — J4541 Moderate persistent asthma with (acute) exacerbation: Secondary | ICD-10-CM

## 2024-01-31 DIAGNOSIS — Z5982 Transportation insecurity: Secondary | ICD-10-CM

## 2024-01-31 DIAGNOSIS — Z748 Other problems related to care provider dependency: Secondary | ICD-10-CM

## 2024-01-31 DIAGNOSIS — Z5901 Sheltered homelessness: Secondary | ICD-10-CM

## 2024-01-31 DIAGNOSIS — F17210 Nicotine dependence, cigarettes, uncomplicated: Secondary | ICD-10-CM

## 2024-01-31 LAB — GLUCOSE, POCT (MANUAL RESULT ENTRY): POC Glucose: 378 mg/dL — AB (ref 70–99)

## 2024-01-31 MED ORDER — ALBUTEROL SULFATE HFA 108 (90 BASE) MCG/ACT IN AERS
2.0000 | INHALATION_SPRAY | Freq: Four times a day (QID) | RESPIRATORY_TRACT | 2 refills | Status: DC | PRN
  Filled 2024-01-31: qty 18, 25d supply, fill #0

## 2024-01-31 NOTE — Progress Notes (Signed)
 Nursing Intake Note - Returning Patient  [facility]  Chief Complaint: Cough Chief Complaint  Patient presents with   Diabetes    Wants blood sugar checked    Cough    Wheezing, dry cough, 3 weeks, wakes up middle night 17 yr pack smoker smokes 1/2 dose. History of asthma   Living Situation: [employer] Status:  Medicaid   Patient Status:  [x]  Returning patient to [employer]  Confirmed demographics and emergency Psychologist, forensic previously reviewed  Consent for digital charting: [x]  Previously Signed []  Signed Today []  Not Signed  Additional Notes:  Vital signs taken and entered in flowsheet  Interpreter services: []  Needed [x]  Not Needed  Brief SDOH update completed  Referral to provider: []  Needed [x]  Not Needed  RN Interventions Provided Today:  [x]  Health education (diabetes and nutrition)  [x]  Other: Pt has scheduled appt in July for full workup with PCP. Presents today with cough that keeps him awake at night. Pt is a smoker, understands this is not ideal. H/O diabetes, recently changed to trulicity  and reports lower blood sugars.

## 2024-01-31 NOTE — Progress Notes (Signed)
 Established Patient Office Visit  Subjective   Patient ID: Leon Ross, male    DOB: 1970/09/21  Age: 53 y.o. MRN: [id number]  Chief Complaint  Patient presents with   Diabetes    Wants blood sugar checked    Cough    Wheezing, dry cough, 3 weeks, wakes up middle night 17 yr pack smoker smokes 1/2 dose. History of asthma   Glucose: 378 today 10:03 am Diabetes He presents for his follow-up diabetic visit. He has type 2 diabetes mellitus. His disease course has been fluctuating. Pertinent negatives for hypoglycemia include no confusion, dizziness, headaches, hunger, mood changes, nervousness/anxiousness, pallor, seizures, sleepiness, speech difficulty, sweats or tremors. There are no diabetic associated symptoms. Pertinent negatives for diabetes include no chest pain and no weight loss. There are no hypoglycemic complications. Symptoms are stable. There are no diabetic complications. Risk factors for coronary artery disease include diabetes mellitus, dyslipidemia, family history, male sex, obesity, hypertension, sedentary lifestyle, stress and tobacco exposure. He is compliant with treatment most of the time. He is following a generally healthy diet. Meal planning includes avoidance of concentrated sweets. There is no change in his home blood glucose trend. His breakfast blood glucose range is generally 180-200 mg/dl. Eye exam is not current.  Cough This is a new problem. The current episode started 1 to 4 weeks ago (3 weeks). The problem has been gradually worsening. The problem occurs every few minutes. The cough is Non-productive. Associated symptoms include shortness of breath and wheezing. Pertinent negatives include no chest pain, chills, ear congestion, ear pain, fever, headaches, heartburn, hemoptysis, myalgias, nasal congestion, postnasal drip, rash, rhinorrhea, sore throat, sweats or weight loss. The symptoms are aggravated by lying down. Risk factors for lung disease include  smoking/tobacco exposure. He has tried OTC cough suppressant (mucines) for the symptoms. The treatment provided no relief. His past medical history is significant for asthma. There is no history of bronchiectasis, bronchitis, COPD, emphysema, environmental allergies or pneumonia.      Review of Systems  Constitutional:  Negative for chills, fever and weight loss.  HENT:  Negative for ear pain, postnasal drip, rhinorrhea and sore throat.   Respiratory:  Positive for cough, shortness of breath and wheezing. Negative for hemoptysis.   Cardiovascular:  Negative for chest pain.  Gastrointestinal:  Negative for heartburn.  Musculoskeletal:  Negative for myalgias.  Skin:  Negative for pallor and rash.  Neurological:  Negative for dizziness, tremors, seizures, speech difficulty and headaches.  Endo/Heme/Allergies:  Negative for environmental allergies.  Psychiatric/Behavioral:  Negative for confusion. The patient is not nervous/anxious.       Objective:     There were no vitals taken for this visit.   Physical Exam Vitals and nursing note reviewed.  Constitutional:      Appearance: Normal appearance. He is obese.   Cardiovascular:     Rate and Rhythm: Normal rate and regular rhythm.     Pulses: Normal pulses.     Heart sounds: Normal heart sounds.  Pulmonary:     Effort: Pulmonary effort is normal.     Breath sounds: Wheezing (inspiratory upper left lung base) present.   Neurological:     Mental Status: He is alert.      No results found for any visits on 01/31/24.    The ASCVD Risk score (Arnett DK, et al., 2019) failed to calculate for the following reasons:   Unable to determine if patient is Non-Hispanic African American    Assessment & Plan:  Problem List Items Addressed This Visit   None Visit Diagnoses       Moderate persistent reactive airway disease with wheezing with acute exacerbation    -  Primary   Relevant Medications   albuterol (VENTOLIN HFA) 108  (90 Base) MCG/ACT inhaler      PLAN: Smoking Cessation Counseling  Assessment:  [x]  Patient currently smokes [x]  cigarettes []  cigars []  vaping / e-cigarettes  [x]  Frequency: ___8__ / day  []  Motivation to quit: []  High [x]  Moderate []  Low  []  Previous quit attempts: [x]  Yes []  No  []  Readiness to quit: []  Within 30 days []  Considering [x]  Not ready  Counseling Provided:  [x]  Advised patient to quit smoking to reduce risk of []  cancer [x]  heart disease [x]  COPD []  CKD []  other  [x]  5 A's addressed (Ask, Advise, Assess, Assist, Arrange)  [x]  Reviewed health benefits of quitting, even after long-term use  [x]  Reinforced that nicotine addiction is medical and treatable  [x]  Provided verbal and written resources  Treatment Options Discussed:  Pharmacotherapy (as appropriate):  []  Nicotine Replacement Therapy (NRT): patch / gum / lozenge / combo  []  Bupropion SR 150 mg daily x 3 days, then BID (start 1 week before quit date)  []  Varenicline 0.5 mg daily ? 0.5 mg BID ? 1 mg BID (start 1 week before quit date)  []  Rx sent / not sent today  []  Reviewed side effects and quit protocol  Behavioral Support:  []  Referred to [place]: 1-800-QUIT-NOW  []  Offered counseling via primary care, behavioral health, or telehealth  [x]  Encouraged journaling triggers, using stress management techniques  Follow-Up:  []  Set quit date: ______________  []  RTC in 2-4 weeks to monitor progress, med tolerance, and reinforce plan  [x]  Encouraged patient to call with questions or for relapse support    ?? Yellow Zone - Asthma is Getting Worse Symptoms: Cough, wheeze, chest tightness, or shortness of breath; waking at night; can do some but not all usual activities.  Peak Flow: 50-79% of personal best  Take these steps:  Albuterol HFA 90 mcg: 2-4 puffs every 4-6 hours as needed with spacer OR  Albuterol Nebulizer: 2.5 mg via nebulizer every 4-6 hours as needed  If symptoms  don't improve in 24-48 hours:  Add oral corticosteroid if prescribed (e.g., Prednisone)  Call your provider  ?? If using Albuterol more than 2x/week (not counting exercise), asthma may not be well-controlled.   Nursing Note - Transportation Insecurity Identified  Reason for Visit: Patient reports difficulty accessing healthcare appointments, pharmacy, or basic needs due to lack of reliable transportation.  Assessment: ? Transportation insecurity confirmed ? Patient unable to attend follow-up care or obtain medications consistently ? Transportation barriers impacting chronic disease management / preventive care  Nursing Interventions: ? Bus card / public transportation pass provided today ? Patient educated on how to use transportation pass and available routes ? Discussed local transportation assistance programs (e.g., [insert local resource]) ? Referral sent to care coordination / social work for ongoing transportation support  SDOH Documentation (ICD-10 Z Code): ? Z61.82 - Transportation insecurity  Patient Response: Patient expressed understanding and appreciation. Will use transportation pass to attend for pharmacy pick up     No follow-ups on file.    BALDWIN RIGGS, NP

## 2024-01-31 NOTE — Patient Instructions (Signed)
 Smoking Cessation Support - Patient Resources  Thank you for discussing your tobacco use today. Quitting smoking is one of the most important steps you can take to improve your health.  Here are resources to support your quit journey:  ?? [facility] - [employer] 1-800-QUIT-NOW ((434)595-8612)  Text READY to 200-400  Visit [email or web address] to enroll  Services include:  Free phone coaching  Nicotine replacement therapy (patch, gum, lozenge)  Personalized quit plans  Multi-language support  ?? Free [employer] & Text Support  SmokefreeTXT: Sign up by texting QUIT to 430 014 0421  quitSTART app: Download from the [employer] or [employer] Quit Smoking App: Tools for tracking cravings and triggers  ?? Quick Tips for Quitting  Set a quit date and prepare  Remove tobacco products from home/car  Identify triggers and plan coping strategies  Drink water, stay active, and reach out for support  ?? Follow-Up We're here to help! Call us  or return for support or medication management at any time.  You've got this--we're with you every step of the way.

## 2024-02-03 ENCOUNTER — Encounter (HOSPITAL_COMMUNITY): Payer: Self-pay

## 2024-02-03 ENCOUNTER — Ambulatory Visit (INDEPENDENT_AMBULATORY_CARE_PROVIDER_SITE_OTHER): Payer: MEDICAID

## 2024-02-03 ENCOUNTER — Other Ambulatory Visit: Payer: Self-pay

## 2024-02-03 ENCOUNTER — Ambulatory Visit (HOSPITAL_COMMUNITY)
Admission: EM | Admit: 2024-02-03 | Discharge: 2024-02-03 | Disposition: A | Discharge status: Home / Self Care | Payer: MEDICAID | Attending: Emergency Medicine | Admitting: Emergency Medicine

## 2024-02-03 DIAGNOSIS — R053 Chronic cough: Secondary | ICD-10-CM

## 2024-02-03 DIAGNOSIS — J4541 Moderate persistent asthma with (acute) exacerbation: Secondary | ICD-10-CM | POA: Diagnosis not present

## 2024-02-03 MED ORDER — ALBUTEROL SULFATE HFA 108 (90 BASE) MCG/ACT IN AERS
INHALATION_SPRAY | RESPIRATORY_TRACT | Status: AC
  Filled 2024-02-03: qty 6.7

## 2024-02-03 MED ORDER — ALBUTEROL SULFATE HFA 108 (90 BASE) MCG/ACT IN AERS
2.0000 | INHALATION_SPRAY | Freq: Once | RESPIRATORY_TRACT | Status: AC
  Administered 2024-02-03: 2 via RESPIRATORY_TRACT

## 2024-02-03 MED ORDER — BENZONATATE 100 MG PO CAPS
100.0000 mg | ORAL_CAPSULE | Freq: Three times a day (TID) | ORAL | 0 refills | Status: DC | PRN
  Filled 2024-02-03: qty 30, 10d supply, fill #0

## 2024-02-03 MED ORDER — DOXYCYCLINE HYCLATE 100 MG PO TABS
100.0000 mg | ORAL_TABLET | Freq: Two times a day (BID) | ORAL | 0 refills | Status: AC
  Filled 2024-02-03: qty 10, 5d supply, fill #0
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
## 2024-02-03 NOTE — ED Triage Notes (Signed)
 Chief Complaint: dry cough, denies any other sick symptoms.   Sick exposure: Yes- Patient lives in a homeless shelter but no known contact.   Onset: 2 weeks  Prescriptions or OTC medications tried: Yes- Mucinex    with little relief  New foods, medications, or products: No  Recent Travel: No

## 2024-02-03 NOTE — Discharge Instructions (Addendum)
 Please use the albuterol  inhaler 3 times daily (every 6 hours) for the next several days. Then continue as needed.  The tessalon cough pills can be taken 3 times daily, 1-2 pills at a time.  Your chest xray does not show any infection. However with 2 weeks of symptoms I am treating you with an antibiotic. Please take the doxycyline as prescribed. Take with food to avoid upset stomach.  Please return if symptoms are persisting

## 2024-02-08 ENCOUNTER — Encounter: Payer: Self-pay | Admitting: *Deleted

## 2024-02-08 ENCOUNTER — Encounter: Payer: Self-pay | Admitting: Physician Assistant

## 2024-02-08 LAB — GLUCOSE, POCT (MANUAL RESULT ENTRY): POC Glucose: 168 mg/dL — AB (ref 70–99)

## 2024-02-08 NOTE — Progress Notes (Signed)
 Pt checks CBG several times a week. 168 today. Says is compliant w/ meds.   Has had nasal congestion x 2 days. He went to UC on 06/27 w/ cough, asthma.  Was seen by [facility] NP on 06/24 for asthma flare.   Still smoking.   Still w/ cough, no nasal drainage, but has had to blow nose more than usual. No wheezing now.   He has some fluid in his ears, was given saline nasal spray and Chlorpheniramine.   He was given till next Thurs, July 10 to get housing or a job. He has an appt w/ Social Services on July 9th. He was working w/ [employer] but has been denied disability 3 times.   Shona Shad, PA-C 02/08/2024 3:24 PM

## 2024-02-08 NOTE — Congregational Nurse Program (Signed)
  Dept: [phone]   Congregational Nurse Program Note  Date of Encounter: 02/08/2024  Past Medical History: Past Medical History:  Diagnosis Date   Allergy    Anxiety    Arthritis    Asthma    Depression    Diabetes (HCC)    Erectile dysfunction    Gastroparesis    GERD (gastroesophageal reflux disease)    Hyperlipidemia    Hypertension    Multiple fractures of ribs, right side, init for clos fx 12/22/2022   Obesity    Open wound of left foot excluding one or more toes 10/18/2023   Left lateral mallelous and heel from wearing boots with no socks.     Schizoaffective disorder [facility])    Sleep apnea     Encounter Details:  Community Questionnaire - 02/08/24 1308       Questionnaire   Ask client: Do you give verbal consent for me to treat you today? Yes    Student Assistance N/A    Location Patient Served  [facility]    Encounter Setting CN site    Population Status Unhoused    Insurance Medicaid    Insurance/Financial Assistance Referral N/A    Medication N/A    Medical Provider Yes    Screening Referrals Made N/A    Medical Referrals Made [facility] PCP[facility]inic    Medical Appointment Completed N/A    CNP Interventions Advocate/Support;Navigate Healthcare System    Screenings CN Performed Blood Pressure;Blood Glucose    ED Visit Averted N/A    Life-Saving Intervention Made N/A         Client signed paper to see MD in [facility] clinic. Completed triage form for client to see MD..Blood pressure 120/81, pulse (!) 110, SpO2 95%.  Sania Noy W RN CN

## 2024-02-09 ENCOUNTER — Telehealth: Payer: Self-pay

## 2024-02-09 NOTE — Progress Notes (Signed)
 Complex Care Management Note  Care Guide Note 02/09/2024 Name: Freeland Pracht MRN: [id number] DOB: 01-Apr-1971  Leon Ross is a 53 y.o. year old male who sees Wheeler Harlene CROME, NP for primary care. I reached out to Lonni Rash by phone today to offer complex care management services.  Mr. Faciane was given information about Complex Care Management services today including:   The Complex Care Management services include support from the care team which includes your Nurse Care Manager, Clinical Social Worker, or Pharmacist.  The Complex Care Management team is here to help remove barriers to the health concerns and goals most important to you. Complex Care Management services are voluntary, and the patient may decline or stop services at any time by request to their care team member.   Complex Care Management Consent Status: Patient agreed to services and verbal consent obtained.   Follow up plan:  Telephone appointment with complex care management team member scheduled for:  02/21/24 at 2:00 p.m.  Encounter Outcome:  Patient Scheduled  Dreama [facility]  [facility], [facility] Care Management Assistant Direct Dial: [phone]  Fax: [phone]

## 2024-02-21 ENCOUNTER — Other Ambulatory Visit: Payer: MEDICAID | Admitting: Licensed Clinical Social Worker

## 2024-02-21 NOTE — Patient Outreach (Signed)
 Complex Care Management   Visit Note  02/21/2024  Name:  Leon Ross MRN: [id number] DOB: 1970-10-11  Situation: Referral received for Complex Care Management related to SDOH Barriers:  Housing patient is in a shelter I obtained verbal consent from Patient.  Visit completed with patient  on the phone  Background:   Past Medical History:  Diagnosis Date   Allergy    Anxiety    Arthritis    Asthma    Depression    Diabetes (HCC)    Erectile dysfunction    Gastroparesis    GERD (gastroesophageal reflux disease)    Hyperlipidemia    Hypertension    Multiple fractures of ribs, right side, init for clos fx 12/22/2022   Obesity    Open wound of left foot excluding one or more toes 10/18/2023   Left lateral mallelous and heel from wearing boots with no socks.     Schizoaffective disorder (HCC)    Sleep apnea     Assessment: Patient is already on the coordinator housing list and he is currently #40, and stated that he calls weekly and he is getting closer. A program with partnership to end homelessness   Recommendation:   none  Follow Up Plan:   Closing From:  Complex Care Management Tobias CHARM Maranda HEDWIG, PhD [facility], [facility] Social Worker Direct Dial: [phone]  Fax: [phone]

## 2024-02-21 NOTE — Patient Instructions (Signed)
 Visit Information  Thank you for taking time to visit with me today. Please don't hesitate to contact me if I can be of assistance to you before our next scheduled appointment.  Our next appointment is no further scheduled appointments.   Please call the care guide team at [phone] if you need to cancel or reschedule your appointment.   Following is a copy of your care plan:   Goals Addressed             This Visit's Progress    BSW VBCI Social Work Care Plan       Problems:   Housing   CSW Clinical Goal(s):   Over the next 2 months the Patient will will follow up with [facility] as directed by Social Work.  Interventions:  Patient is already on the Coordinator entry program an is #40, the number is getting close   Patient Goals/Self-Care Activities:  [employer]  Plan: SW will not follow up, patient has everything in place        Please call the Suicide and Crisis Lifeline: 988 go to [facility] [address] [place] [phone]) call 911 if you are experiencing a Mental Health or Behavioral Health Crisis or need someone to talk to.  Patient verbalizes understanding of instructions and care plan provided today and agrees to view in MyChart. Active MyChart status and patient understanding of how to access instructions and care plan via MyChart confirmed with patient.     Tobias CHARM Maranda HEDWIG, PhD [facility], [facility] Social Worker Direct Dial: [phone]  Fax: [phone]

## 2024-02-23 ENCOUNTER — Encounter: Payer: Self-pay | Admitting: Student

## 2024-02-23 DIAGNOSIS — Z0000 Encounter for general adult medical examination without abnormal findings: Secondary | ICD-10-CM | POA: Insufficient documentation

## 2024-02-23 NOTE — Assessment & Plan Note (Deleted)
 Recheck A1C.  Type 2 diabetes ,poorly controlled. Currently on long-acting insulin  at 50 units, Trulicity  0.75 mg weekly,  glyburide  and metformin .  Refills provided . Referral sent to endocrinology for further evaluation.   Provided Pt education r/t morning fasting blood sugar and titrating long-acting insulin  based on fasting AM blood sugar. Goal AM Fasting BS between 80-130.  Directions for titration provided to patient in AVS. Patient voices understanding and had no further questions

## 2024-02-23 NOTE — Assessment & Plan Note (Deleted)
 Stable on atorvastatin  10 mg daily. Update Lipid panel.

## 2024-02-23 NOTE — Patient Instructions (Incomplete)
 Insulin  Glargine (SEMGLEE )- Long Acting insulin  Insulin  Adjustment Instructions:  -Adjust your long-acting insulin  (glargine) based on your morning (fasting) blood sugar levels: -If your morning blood sugar is consistently over 130 mg/dL, increase your glargine dose by 2 units every 3 days. -If your morning blood sugar drops below 80 mg/dL, decrease your dose by 2 units.  Contact the clinic if you have questions or experience low blood sugars.
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
## 2024-02-23 NOTE — Assessment & Plan Note (Deleted)
 Stable on Paliperidone  (Invega ). Encourage FU with BH.

## 2024-02-24 ENCOUNTER — Ambulatory Visit: Admitting: Nurse Practitioner

## 2024-02-26 ENCOUNTER — Emergency Department (HOSPITAL_COMMUNITY)
Admission: EM | Admit: 2024-02-26 | Discharge: 2024-02-27 | Disposition: A | Discharge status: Other Acute Inpatient Hospital | Payer: MEDICAID | Attending: Emergency Medicine | Admitting: Emergency Medicine

## 2024-02-26 ENCOUNTER — Other Ambulatory Visit: Payer: Self-pay

## 2024-02-26 DIAGNOSIS — E119 Type 2 diabetes mellitus without complications: Secondary | ICD-10-CM

## 2024-02-26 DIAGNOSIS — E1165 Type 2 diabetes mellitus with hyperglycemia: Secondary | ICD-10-CM | POA: Diagnosis not present

## 2024-02-26 DIAGNOSIS — T450X2A Poisoning by antiallergic and antiemetic drugs, intentional self-harm, initial encounter: Secondary | ICD-10-CM | POA: Insufficient documentation

## 2024-02-26 DIAGNOSIS — R739 Hyperglycemia, unspecified: Secondary | ICD-10-CM

## 2024-02-26 DIAGNOSIS — T50902A Poisoning by unspecified drugs, medicaments and biological substances, intentional self-harm, initial encounter: Secondary | ICD-10-CM

## 2024-02-26 DIAGNOSIS — Z794 Long term (current) use of insulin: Secondary | ICD-10-CM | POA: Insufficient documentation

## 2024-02-26 DIAGNOSIS — Z7984 Long term (current) use of oral hypoglycemic drugs: Secondary | ICD-10-CM | POA: Diagnosis not present

## 2024-02-26 DIAGNOSIS — F430 Acute stress reaction: Secondary | ICD-10-CM | POA: Insufficient documentation

## 2024-02-26 DIAGNOSIS — Z91148 Patient's other noncompliance with medication regimen for other reason: Secondary | ICD-10-CM | POA: Insufficient documentation

## 2024-02-26 LAB — COMPREHENSIVE METABOLIC PANEL WITH GFR
ALT: 19 U/L (ref 0–44)
AST: 19 U/L (ref 15–41)
Albumin: 4.4 g/dL (ref 3.5–5.0)
Alkaline Phosphatase: 100 U/L (ref 38–126)
Anion gap: 13 (ref 5–15)
BUN: 13 mg/dL (ref 6–20)
CO2: 22 mmol/L (ref 22–32)
Calcium: 9.8 mg/dL (ref 8.9–10.3)
Chloride: 101 mmol/L (ref 98–111)
Creatinine, Ser: 0.97 mg/dL (ref 0.61–1.24)
GFR, Estimated: >60 mL/min (ref >60)
Glucose, Bld: 361 mg/dL — ABNORMAL HIGH (ref 70–99)
Potassium: 3.9 mmol/L (ref 3.5–5.1)
Sodium: 136 mmol/L (ref 135–145)
Total Bilirubin: 1.9 mg/dL — ABNORMAL HIGH (ref 0.0–1.2)
Total Protein: 7.8 g/dL (ref 6.5–8.1)

## 2024-02-26 LAB — CBC
HCT: 46.5 % (ref 39.0–52.0)
Hemoglobin: 15.5 g/dL (ref 13.0–17.0)
MCH: 26.4 pg (ref 26.0–34.0)
MCHC: 33.3 g/dL (ref 30.0–36.0)
MCV: 79.2 fL — ABNORMAL LOW (ref 80.0–100.0)
Platelets: 306 K/uL (ref 150–400)
RBC: 5.87 MIL/uL — ABNORMAL HIGH (ref 4.22–5.81)
RDW: 14 % (ref 11.5–15.5)
WBC: 16.7 K/uL — ABNORMAL HIGH (ref 4.0–10.5)
nRBC: 0 % (ref 0.0–0.2)

## 2024-02-26 LAB — ACETAMINOPHEN LEVEL: Acetaminophen (Tylenol), Serum: <10 ug/mL — ABNORMAL LOW (ref 10–30)

## 2024-02-26 LAB — RAPID URINE DRUG SCREEN, HOSP PERFORMED
Amphetamines: NOT DETECTED
Barbiturates: NOT DETECTED
Benzodiazepines: NOT DETECTED
Cocaine: NOT DETECTED
Opiates: NOT DETECTED
Tetrahydrocannabinol: NOT DETECTED

## 2024-02-26 LAB — SALICYLATE LEVEL: Salicylate Lvl: <7 mg/dL — ABNORMAL LOW (ref 7.0–30.0)

## 2024-02-26 LAB — ETHANOL: Alcohol, Ethyl (B): <15 mg/dL (ref <15)

## 2024-02-26 MED ORDER — INSULIN ASPART 100 UNIT/ML IJ SOLN
8.0000 [IU] | Freq: Once | INTRAMUSCULAR | Status: AC
  Administered 2024-02-26: 8 [IU] via SUBCUTANEOUS
  Filled 2024-02-26: qty 0.08

## 2024-02-26 NOTE — ED Triage Notes (Signed)
 Pt brought to emergency department for intentional overdose on 25mg  hydroxyzine . Pt states that he took 15 caps and wanted to die at the time. Pt got into an argument with his daughter and states that he made a bad call. Pt denies wanting to die and is not wanting to harm himself.

## 2024-02-26 NOTE — ED Provider Notes (Addendum)
 [facility] AT [facility] Provider Note   CSN: [id number] Arrival date & time: 02/26/24  1746     Patient presents with: Drug Overdose (intentional)   Leon Ross is a 53 y.o. male.   Pt with c/o feeling stressed, depressed and taking overdose of 15 vistaril  tablets 2-3 hours ago. Indicates several recent stressors including a job prospect falling through, an argument with daughter, and being told by [employer] that his 90 days where up/over due.  Normal appetite. No nvd. Is able to sleep at night. Current feels mildly drowsy, but denies other new symptoms. Denies wanting to harm/kill self, no thoughts of harm to others.   The history is provided by the patient and medical records.  Drug Overdose Pertinent negatives include no chest pain, no abdominal pain, no headaches and no shortness of breath.       Prior to Admission medications   Medication Sig Start Date End Date Taking? Authorizing Provider  ACCU-CHEK GUIDE TEST test strip USE TO CHECK BLOOD GLUCOSE 2 TO 3 TIMES DAILY 06/24/23   [provider]  Accu-Chek Softclix Lancets lancets SMARTSIG:Topical 2-3 Times Daily 06/24/23   [provider]  albuterol  (VENTOLIN  HFA) 108 (90 Base) MCG/ACT inhaler Inhale 2 puffs into the lungs every 6 (six) hours as needed for wheezing or shortness of breath. 01/31/24   Baldwin Riggs, NP  atorvastatin  (LIPITOR) 10 MG tablet Take 1 tablet (10 mg total) by mouth daily. 11/24/23   Brien Belvie BRAVO, MD  benzonatate  (TESSALON ) 100 MG capsule Take 1 capsule (100 mg total) by mouth 3 (three) times daily as needed for cough. 02/03/24   Rising, Asberry, PA-C  Blood Glucose Monitoring Suppl (ACCU-CHEK GUIDE) w/Device KIT USE AS DIRECTED TO CHECK BLOOD SUGARS 06/24/23   [provider]  Dulaglutide  (TRULICITY ) 0.75 MG/0.5ML SOAJ Inject 0.75 mg into the skin once a week. 01/26/24   Wheeler Harlene CROME, NP  glyBURIDE  (DIABETA ) 5 MG tablet Take 2  tablets (10 mg total) by mouth 2 (two) times daily. 01/26/24   Yacopino, Jessica L, NP  hydrOXYzine  (VISTARIL ) 25 MG capsule Take 1 capsule (25 mg total) by mouth every 6 (six) hours as needed. 01/26/24   Yacopino, Jessica L, NP  Insulin  Glargine (BASAGLAR  KWIKPEN) 100 UNIT/ML Inject 50 Units into the skin daily. 01/26/24   Wheeler Harlene CROME, NP  Insulin  Pen Needle (HEALTHY ACCENTS UNIFINE PENTIP) 31G X 6 MM MISC Inject 1 pen  into the skin daily. 11/09/23   Brien Belvie BRAVO, MD  metFORMIN  (GLUCOPHAGE ) 1000 MG tablet Take 1 tablet (1,000 mg total) by mouth 2 (two) times daily. 11/17/23   Barrett, Shona MATSU, PA-C  paliperidone  (INVEGA ) 3 MG 24 hr tablet Take 1 tablet (3 mg total) by mouth daily. 01/26/24   Yacopino, Jessica L, NP  sertraline  (ZOLOFT ) 50 MG tablet Take 3 tablets (150 mg total) by mouth daily. 01/26/24   Yacopino, Jessica L, NP    Allergies: Lithium, Benztropine, and Depakote [divalproex sodium]    Review of Systems  Constitutional:  Negative for fever.  HENT:  Negative for sore throat.   Respiratory:  Negative for shortness of breath.   Cardiovascular:  Negative for chest pain.  Gastrointestinal:  Negative for abdominal pain.  Genitourinary:  Negative for flank pain.  Musculoskeletal:  Negative for back pain and neck pain.  Neurological:  Negative for headaches.  Psychiatric/Behavioral:  Positive for dysphoric mood.     Updated Vital Signs BP (!) 143/75  Pulse (!) 109   Temp 99.3 F (37.4 C) (Oral)   Resp 18   Ht 1.727 m (5' 8)   Wt 106 kg   SpO2 95%   BMI 35.53 kg/m   Physical Exam Vitals and nursing note reviewed.  Constitutional:      Appearance: Normal appearance. He is well-developed.  HENT:     Head: Atraumatic.     Nose: Nose normal.     Mouth/Throat:     Mouth: Mucous membranes are moist.     Pharynx: Oropharynx is clear.  Eyes:     General: No scleral icterus.    Conjunctiva/sclera: Conjunctivae normal.     Pupils: Pupils are equal, round, and  reactive to light.  Neck:     Trachea: No tracheal deviation.  Cardiovascular:     Rate and Rhythm: Normal rate and regular rhythm.     Pulses: Normal pulses.     Heart sounds: Normal heart sounds. No murmur heard.    No friction rub. No gallop.  Pulmonary:     Effort: Pulmonary effort is normal. No accessory muscle usage or respiratory distress.     Breath sounds: Normal breath sounds.  Abdominal:     General: Bowel sounds are normal. There is no distension.     Palpations: Abdomen is soft.     Tenderness: There is no abdominal tenderness. There is no guarding.  Musculoskeletal:        General: No swelling.     Cervical back: Normal range of motion and neck supple. No rigidity.  Skin:    General: Skin is warm and dry.     Findings: No rash.  Neurological:     Mental Status: He is alert.     Comments: Alert, speech clear. Motor/sens grossly intact bil.   Psychiatric:     Comments: Pt acknowledges recent stressors and overdose, but indicates was a bad idea and in heat of moment. Denies ongoing plan or desire to harm self. States is concerned as now may not have place to stay. No delusions or hallucinations noted. Pt does not appear to be responding to internal stimuli.      (all labs ordered are listed, but only abnormal results are displayed) Results for orders placed or performed during the hospital encounter of 02/26/24  Rapid urine drug screen (hospital performed)   Collection Time: 02/26/24  6:56 PM  Result Value Ref Range   Opiates NONE DETECTED NONE DETECTED   Cocaine NONE DETECTED NONE DETECTED   Benzodiazepines NONE DETECTED NONE DETECTED   Amphetamines NONE DETECTED NONE DETECTED   Tetrahydrocannabinol NONE DETECTED NONE DETECTED   Barbiturates NONE DETECTED NONE DETECTED  Comprehensive metabolic panel with GFR   Collection Time: 02/26/24  7:05 PM  Result Value Ref Range   Sodium 136 135 - 145 mmol/L   Potassium 3.9 3.5 - 5.1 mmol/L   Chloride 101 98 - 111 mmol/L    CO2 22 22 - 32 mmol/L   Glucose, Bld 361 (H) 70 - 99 mg/dL   BUN 13 6 - 20 mg/dL   Creatinine, Ser 9.02 0.61 - 1.24 mg/dL   Calcium  9.8 8.9 - 10.3 mg/dL   Total Protein 7.8 6.5 - 8.1 g/dL   Albumin 4.4 3.5 - 5.0 g/dL   AST 19 15 - 41 U/L   ALT 19 0 - 44 U/L   Alkaline Phosphatase 100 38 - 126 U/L   Total Bilirubin 1.9 (H) 0.0 - 1.2 mg/dL   GFR, Estimated >  60 >60 mL/min   Anion gap 13 5 - 15  CBC   Collection Time: 02/26/24  7:05 PM  Result Value Ref Range   WBC 16.7 (H) 4.0 - 10.5 K/uL   RBC 5.87 (H) 4.22 - 5.81 MIL/uL   Hemoglobin 15.5 13.0 - 17.0 g/dL   HCT 53.4 60.9 - 47.9 %   MCV 79.2 (L) 80.0 - 100.0 fL   MCH 26.4 26.0 - 34.0 pg   MCHC 33.3 30.0 - 36.0 g/dL   RDW 85.9 88.4 - 84.4 %   Platelets 306 150 - 400 K/uL   nRBC 0.0 0.0 - 0.2 %  Acetaminophen  level   Collection Time: 02/26/24  7:05 PM  Result Value Ref Range   Acetaminophen  (Tylenol ), Serum <10 (L) 10 - 30 ug/mL  Salicylate level   Collection Time: 02/26/24  7:05 PM  Result Value Ref Range   Salicylate Lvl <7.0 (L) 7.0 - 30.0 mg/dL  Ethanol   Collection Time: 02/26/24  7:05 PM  Result Value Ref Range   Alcohol, Ethyl (B) <15 <15 mg/dL   DG Chest 2 View Result Date: 02/03/2024 CLINICAL DATA:  Cough for 2 weeks. EXAM: CHEST - 2 VIEW COMPARISON:  September 10, 2023. FINDINGS: The heart size and mediastinal contours are within normal limits. Both lungs are clear. Old right rib fractures again noted. IMPRESSION: No active cardiopulmonary disease. Electronically Signed   By: Lynwood Landy Raddle M.D.   On: 02/03/2024 16:11    EKG: EKG Interpretation Date/Time:  Sunday February 26 2024 18:54:40 EDT Ventricular Rate:  113 PR Interval:  134 QRS Duration:  86 QT Interval:  328 QTC Calculation: 450 R Axis:   37  Text Interpretation: Sinus tachycardia Baseline wander Confirmed by Bernard Drivers ([id number]) on 02/26/2024 7:58:35 PM  Radiology: No results found.   Procedures   Medications Ordered in the ED  insulin   aspart (novoLOG ) injection 8 Units (has no administration in time range)                                    Medical Decision Making Problems Addressed: Acute stress reaction causing mixed disturbance of emotion and conduct: acute illness or injury with systemic symptoms that poses a threat to life or bodily functions Hyperglycemia: acute illness or injury Insulin  dependent type 2 diabetes mellitus (HCC): chronic illness or injury with exacerbation, progression, or side effects of treatment that poses a threat to life or bodily functions Intentional overdose, initial encounter [facility]): acute illness or injury with systemic symptoms that poses a threat to life or bodily functions Non compliance w medication regimen: chronic illness or injury  Amount and/or Complexity of Data Reviewed External Data Reviewed: notes. Labs: ordered. Decision-making details documented in ED Course. ECG/medicine tests: ordered and independent interpretation performed. Decision-making details documented in ED Course. Discussion of management or test interpretation with external provider(s): Behavioral health  Risk Prescription drug management. Decision regarding hospitalization.   Iv ns. Continuous pulse ox and cardiac monitoring. Labs ordered/sent.  Differential diagnosis includes overdose, stress reaction, depression, etc. Dispo decision including potential need for admission considered - will get labs and reassess.   Labs reviewed/interpreted by me - glucose high, hco3 normal. Pt not compliant w dm/diet/meds. Novolog  sq.  Wbc elev, no fever, chills or sweats.   Reviewed nursing notes and prior charts for additional history. External reports reviewed.   Cardiac monitor: sinus rhythm, rate 94.  Staff  notified poison control.   BH team consulted.   The patient has been placed in psychiatric observation due to the need to provide a safe environment for the patient while obtaining psychiatric consultation  and evaluation, as well as ongoing medical and medication management to treat the patient's condition.    Dispo per [facility] team.   Med rec pending.   CRITICAL CARE RE:  intentional overdose Performed by: Dallie Patton E Mckennah Kretchmer Total critical care time: 40 minutes Critical care time was exclusive of separately billable procedures and treating other patients. Critical care was necessary to treat or prevent imminent or life-threatening deterioration. Critical care was time spent personally by me on the following activities: development of treatment plan with patient and/or surrogate as well as nursing, discussions with consultants, evaluation of patient's response to treatment, examination of patient, obtaining history from patient or surrogate, ordering and performing treatments and interventions, ordering and review of laboratory studies, ordering and review of radiographic studies, pulse oximetry and re-evaluation of patient's condition.  Recheck 2300, vitals normal, no distress, no new c/o. Now is ~ 8 hours since possible ingestion and appears medically clear for bh eval.   Dispo per bh team. Signed out to oncoming EDP.     Final diagnoses:  None    ED Discharge Orders     None          Bernard Drivers, MD 02/26/24 LOVELL    Bernard Drivers, MD 02/26/24 [phone]

## 2024-02-26 NOTE — ED Notes (Signed)
 Pt changed into burgundy scrubs and belongings (clothing/cigarettes/lighter) are placed in cabinet at 9-25 nurses station.

## 2024-02-26 NOTE — ED Notes (Signed)
 Pr poison control - 8 hour post ingestion observation. Estimated ingestion 5p. 4 hr EKG, tylenol , and basic labs. Monitor for QTC elongation and agitation, give benzos if needed for agitation or seizure. If QTC is long, replace K and Mg to high normal. Repeat EKG to medically clear.

## 2024-02-26 NOTE — ED Notes (Signed)
 Update to poison control, recommends tylenol  level and d/c if no change and level is WNL

## 2024-02-26 NOTE — BH Assessment (Signed)
 TTS was informed that pt is currently not medically cleared. Per Hala, RN, pt is to be observed for 8 hours post ingestion (1654). TTS will follow up once pt is medically cleared.

## 2024-02-26 NOTE — ED Notes (Signed)
 Pt given sandwich, applesauce, Sprite

## 2024-02-27 ENCOUNTER — Inpatient Hospital Stay
Admission: AD | Admit: 2024-02-27 | Discharge: 2024-03-04 | DRG: 885 | Disposition: A | Discharge status: Home / Self Care | Payer: MEDICAID | Source: Intra-hospital | Attending: Psychiatry | Admitting: Psychiatry

## 2024-02-27 ENCOUNTER — Encounter: Payer: Self-pay | Admitting: Psychiatry

## 2024-02-27 DIAGNOSIS — F17210 Nicotine dependence, cigarettes, uncomplicated: Secondary | ICD-10-CM | POA: Diagnosis present

## 2024-02-27 DIAGNOSIS — Z7984 Long term (current) use of oral hypoglycemic drugs: Secondary | ICD-10-CM

## 2024-02-27 DIAGNOSIS — Z803 Family history of malignant neoplasm of breast: Secondary | ICD-10-CM | POA: Diagnosis not present

## 2024-02-27 DIAGNOSIS — Z888 Allergy status to other drugs, medicaments and biological substances status: Secondary | ICD-10-CM

## 2024-02-27 DIAGNOSIS — Z818 Family history of other mental and behavioral disorders: Secondary | ICD-10-CM | POA: Diagnosis not present

## 2024-02-27 DIAGNOSIS — E785 Hyperlipidemia, unspecified: Secondary | ICD-10-CM | POA: Diagnosis present

## 2024-02-27 DIAGNOSIS — T43591A Poisoning by other antipsychotics and neuroleptics, accidental (unintentional), initial encounter: Secondary | ICD-10-CM | POA: Diagnosis present

## 2024-02-27 DIAGNOSIS — E669 Obesity, unspecified: Secondary | ICD-10-CM | POA: Diagnosis present

## 2024-02-27 DIAGNOSIS — Z91148 Patient's other noncompliance with medication regimen for other reason: Secondary | ICD-10-CM

## 2024-02-27 DIAGNOSIS — Z5986 Financial insecurity: Secondary | ICD-10-CM

## 2024-02-27 DIAGNOSIS — F4310 Post-traumatic stress disorder, unspecified: Secondary | ICD-10-CM | POA: Diagnosis present

## 2024-02-27 DIAGNOSIS — F603 Borderline personality disorder: Secondary | ICD-10-CM | POA: Diagnosis present

## 2024-02-27 DIAGNOSIS — K3184 Gastroparesis: Secondary | ICD-10-CM | POA: Diagnosis present

## 2024-02-27 DIAGNOSIS — Z8261 Family history of arthritis: Secondary | ICD-10-CM

## 2024-02-27 DIAGNOSIS — Z79899 Other long term (current) drug therapy: Secondary | ICD-10-CM

## 2024-02-27 DIAGNOSIS — Z560 Unemployment, unspecified: Secondary | ICD-10-CM

## 2024-02-27 DIAGNOSIS — J45909 Unspecified asthma, uncomplicated: Secondary | ICD-10-CM | POA: Diagnosis present

## 2024-02-27 DIAGNOSIS — E1143 Type 2 diabetes mellitus with diabetic autonomic (poly)neuropathy: Secondary | ICD-10-CM | POA: Diagnosis present

## 2024-02-27 DIAGNOSIS — F323 Major depressive disorder, single episode, severe with psychotic features: Secondary | ICD-10-CM | POA: Diagnosis present

## 2024-02-27 DIAGNOSIS — Z801 Family history of malignant neoplasm of trachea, bronchus and lung: Secondary | ICD-10-CM

## 2024-02-27 DIAGNOSIS — I10 Essential (primary) hypertension: Secondary | ICD-10-CM | POA: Diagnosis present

## 2024-02-27 DIAGNOSIS — Z5900 Homelessness unspecified: Secondary | ICD-10-CM

## 2024-02-27 DIAGNOSIS — K219 Gastro-esophageal reflux disease without esophagitis: Secondary | ICD-10-CM | POA: Diagnosis present

## 2024-02-27 DIAGNOSIS — F419 Anxiety disorder, unspecified: Secondary | ICD-10-CM | POA: Diagnosis present

## 2024-02-27 DIAGNOSIS — Z765 Malingerer [conscious simulation]: Secondary | ICD-10-CM

## 2024-02-27 DIAGNOSIS — T450X2A Poisoning by antiallergic and antiemetic drugs, intentional self-harm, initial encounter: Secondary | ICD-10-CM | POA: Diagnosis not present

## 2024-02-27 DIAGNOSIS — Z5982 Transportation insecurity: Secondary | ICD-10-CM

## 2024-02-27 DIAGNOSIS — Z6836 Body mass index (BMI) 36.0-36.9, adult: Secondary | ICD-10-CM

## 2024-02-27 DIAGNOSIS — Z794 Long term (current) use of insulin: Secondary | ICD-10-CM

## 2024-02-27 DIAGNOSIS — F329 Major depressive disorder, single episode, unspecified: Principal | ICD-10-CM | POA: Diagnosis present

## 2024-02-27 DIAGNOSIS — Z9151 Personal history of suicidal behavior: Secondary | ICD-10-CM

## 2024-02-27 LAB — GLUCOSE, CAPILLARY
Glucose-Capillary: 86 mg/dL (ref 70–99)
Glucose-Capillary: 142 mg/dL — ABNORMAL HIGH (ref 70–99)

## 2024-02-27 LAB — ACETAMINOPHEN LEVEL: Acetaminophen (Tylenol), Serum: <10 ug/mL — ABNORMAL LOW (ref 10–30)

## 2024-02-27 LAB — CBG MONITORING, ED
Glucose-Capillary: 333 mg/dL — ABNORMAL HIGH (ref 70–99)
Glucose-Capillary: 260 mg/dL — ABNORMAL HIGH (ref 70–99)

## 2024-02-27 MED ORDER — BASAGLAR KWIKPEN 100 UNIT/ML ~~LOC~~ SOPN: 50.0000 [IU] | PEN_INJECTOR | Freq: Every day | SUBCUTANEOUS | Status: DC

## 2024-02-27 MED ORDER — GLYBURIDE 5 MG PO TABS
10.0000 mg | ORAL_TABLET | Freq: Two times a day (BID) | ORAL | Status: DC
  Administered 2024-02-27 – 2024-03-04 (×12): 10 mg via ORAL
  Filled 2024-02-27 (×13): qty 2

## 2024-02-27 MED ORDER — DIPHENHYDRAMINE HCL 50 MG/ML IJ SOLN: 50.0000 mg | Freq: Three times a day (TID) | INTRAMUSCULAR | Status: DC | PRN

## 2024-02-27 MED ORDER — PALIPERIDONE ER 3 MG PO TB24
3.0000 mg | ORAL_TABLET | Freq: Every day | ORAL | Status: DC
Start: 2024-02-27 — End: 2024-02-28
  Administered 2024-02-27 – 2024-02-28 (×2): 3 mg via ORAL
  Filled 2024-02-27 (×2): qty 1

## 2024-02-27 MED ORDER — LORAZEPAM 2 MG/ML IJ SOLN: 2.0000 mg | Freq: Three times a day (TID) | INTRAMUSCULAR | Status: DC | PRN

## 2024-02-27 MED ORDER — GLYBURIDE 5 MG PO TABS
10.0000 mg | ORAL_TABLET | Freq: Two times a day (BID) | ORAL | Status: DC
  Administered 2024-02-27: 10 mg via ORAL
  Filled 2024-02-27: qty 2

## 2024-02-27 MED ORDER — METFORMIN HCL 500 MG PO TABS
1000.0000 mg | ORAL_TABLET | Freq: Two times a day (BID) | ORAL | Status: DC
  Administered 2024-02-27 – 2024-03-04 (×12): 1000 mg via ORAL
  Filled 2024-02-27 (×13): qty 2

## 2024-02-27 MED ORDER — DIPHENHYDRAMINE HCL 25 MG PO CAPS: 50.0000 mg | ORAL_CAPSULE | Freq: Three times a day (TID) | ORAL | Status: DC | PRN

## 2024-02-27 MED ORDER — ACETAMINOPHEN 325 MG PO TABS
650.0000 mg | ORAL_TABLET | Freq: Four times a day (QID) | ORAL | Status: DC | PRN
  Administered 2024-03-01 – 2024-03-03 (×6): 650 mg via ORAL
  Filled 2024-02-27 (×6): qty 2

## 2024-02-27 MED ORDER — SERTRALINE HCL 25 MG PO TABS
150.0000 mg | ORAL_TABLET | Freq: Every day | ORAL | Status: DC
  Administered 2024-02-27 – 2024-03-02 (×5): 150 mg via ORAL
  Filled 2024-02-27 (×5): qty 2

## 2024-02-27 MED ORDER — HALOPERIDOL 5 MG PO TABS: 5.0000 mg | ORAL_TABLET | Freq: Three times a day (TID) | ORAL | Status: DC | PRN

## 2024-02-27 MED ORDER — HALOPERIDOL LACTATE 5 MG/ML IJ SOLN: 5.0000 mg | Freq: Three times a day (TID) | INTRAMUSCULAR | Status: DC | PRN

## 2024-02-27 MED ORDER — METFORMIN HCL 500 MG PO TABS
1000.0000 mg | ORAL_TABLET | Freq: Two times a day (BID) | ORAL | Status: DC
  Administered 2024-02-27: 1000 mg via ORAL
  Filled 2024-02-27: qty 2

## 2024-02-27 MED ORDER — ALUM & MAG HYDROXIDE-SIMETH 200-200-20 MG/5ML PO SUSP: 30.0000 mL | ORAL | Status: DC | PRN

## 2024-02-27 MED ORDER — INSULIN GLARGINE-YFGN 100 UNIT/ML ~~LOC~~ SOLN
50.0000 [IU] | Freq: Every day | SUBCUTANEOUS | Status: DC
  Administered 2024-02-28 – 2024-03-04 (×6): 50 [IU] via SUBCUTANEOUS
  Filled 2024-02-27 (×6): qty 0.5

## 2024-02-27 MED ORDER — INSULIN GLARGINE-YFGN 100 UNIT/ML ~~LOC~~ SOLN
50.0000 [IU] | Freq: Every day | SUBCUTANEOUS | Status: DC
  Administered 2024-02-27: 50 [IU] via SUBCUTANEOUS
  Filled 2024-02-27: qty 0.5

## 2024-02-27 MED ORDER — HALOPERIDOL LACTATE 5 MG/ML IJ SOLN: 10.0000 mg | Freq: Three times a day (TID) | INTRAMUSCULAR | Status: DC | PRN

## 2024-02-27 MED ORDER — MAGNESIUM HYDROXIDE 400 MG/5ML PO SUSP: 30.0000 mL | Freq: Every day | ORAL | Status: DC | PRN

## 2024-02-27 NOTE — Progress Notes (Signed)
   02/27/24 1600  Psych Admission Type (Psych Patients Only)  Admission Status Voluntary  Psychosocial Assessment  Patient Complaints Depression;Hopelessness;Loneliness  Eye Contact Brief  Facial Expression Flat  Affect Anxious;Appropriate to circumstance  Speech Logical/coherent  Interaction Assertive  Motor Activity Slow  Appearance/Hygiene In scrubs  Behavior Characteristics Cooperative;Appropriate to situation  Mood Anxious;Depressed  Thought Process  Coherency WDL  Content WDL  Delusions None reported or observed  Perception WDL  Hallucination None reported or observed  Judgment WDL  Confusion None  Danger to Self  Current suicidal ideation? Denies  Danger to Others  Danger to Others None reported or observed

## 2024-02-27 NOTE — Plan of Care (Signed)
  Problem: Education: Goal: Knowledge of [place] [facility] Education information/materials will improve Outcome: Progressing Goal: Mental status will improve Outcome: Progressing   Problem: Activity: Goal: Interest or engagement in activities will improve Outcome: Progressing   Problem: Health Behavior/Discharge Planning: Goal: Identification of resources available to assist in meeting health care needs will improve Outcome: Progressing

## 2024-02-27 NOTE — Tx Team (Signed)
 Initial Treatment Plan 02/27/2024 6:41 PM Leon Ross [id number]    PATIENT STRESSORS: Financial difficulties     PATIENT STRENGTHS: Work skills    PATIENT IDENTIFIED PROBLEMS: Depression  Medication adjustment                   DISCHARGE CRITERIA:  Ability to meet basic life and health needs Adequate post-discharge living arrangements  PRELIMINARY DISCHARGE PLAN: Attend aftercare/continuing care group Return to previous living arrangement  PATIENT/FAMILY INVOLVEMENT: This treatment plan has been presented to and reviewed with the patient, Leon Ross. The patient has been given the opportunity to ask questions and make suggestions.  Huel Mall, RN 02/27/2024, 6:41 PM
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
## 2024-02-27 NOTE — Progress Notes (Signed)
 Patient admitted to unit, A&O x4, denies current suicidal ideation, HI, AVH at this time. Pt states that he took 15 vistaril  tablets after being overwhelmed with losing his job and argument with his daughter. Patient endorses anxiety at 7/10 and depression 6/10. Patient contracts for safety, answered all patient questions. Oriented patient to unit, reviewed unit schedule and rules, pt verbalized understanding. Patient in the day room, q15 min safety checks in place, patient safe in the unit.

## 2024-02-27 NOTE — ED Notes (Signed)
 Patient off unit to [facility] BMU per provider. Patient alert and calm. Patient discharge information and belongings given to Safe Transport for facility .  Patient ambulatory off unit, escorted by RN. Patient transported by [employer]

## 2024-02-27 NOTE — Progress Notes (Signed)
 Pt was accepted to [facility] BMU TODAY 02/27/2024 pending (paperwork) faxed to [phone]. Bed assignment: 302  Pt meets inpatient criteria per: Cathaleen Adam, PMHNP   Attending Physician will be: Dr. Donnelly MD   Report can be called to: [phone]  Pt can arrive after: ASAP  Care Team Notified: Cherylynn Ernst RN, Landry Bull RN, Huel Mall RN  [place] Joie Hipps LCSW-A   02/27/2024 11:21 AM

## 2024-02-27 NOTE — ED Notes (Signed)
 Patient moved to [facility] at this time.  Wanded by security prior to move.

## 2024-02-27 NOTE — Progress Notes (Signed)
 PT calm and pleasant during assessment denying SI/HI/AVH. Pt stated he was tired and just wanted to sleep. Pt didn't have any medication scheduled tonight and hasn't requested anything PRN as of now. Pt given education, support, and encouragement to be active in his treatment plan. Pt being monitored Q 15 minutes for safety per unit protocol, remains safe on the unit

## 2024-02-27 NOTE — ED Provider Notes (Signed)
 Emergency Medicine Observation Re-evaluation Note  Leon Ross is a 53 y.o. male, seen on rounds today.  Pt initially presented to the ED for complaints of Drug Overdose (intentional) Currently, the patient is awaiting psychiatric evaluation.  Physical Exam  BP (!) 131/91   Pulse 87   Temp 99.3 F (37.4 C) (Oral)   Resp 16   Ht 1.727 m (5' 8)   Wt 106 kg   SpO2 92%   BMI 35.53 kg/m  Physical Exam General: No acute distress normal vital signs Cardiac: Normal heart rate and blood pressure Lungs: No respiratory distress with normal respiratory rate Psych: Resting at this time  ED Course / MDM  EKG:EKG Interpretation Date/Time:  Sunday February 26 2024 21:58:14 EDT Ventricular Rate:  102 PR Interval:  134 QRS Duration:  84 QT Interval:  338 QTC Calculation: 441 R Axis:   48  Text Interpretation: Sinus tachycardia Confirmed by Bernard Drivers ([id number]) on 02/26/2024 11:16:04 PM  I have reviewed the labs performed to date as well as medications administered while in observation.  Recent changes in the last 24 hours include patient was seen and medically cleared.  Plan  Current plan is for psychiatric evaluation.    Levander Houston, MD 02/27/24 1018

## 2024-02-27 NOTE — ED Notes (Signed)
 Attempted to call report. Nurse request to be called back in about 30 minutes.

## 2024-02-27 NOTE — Group Note (Signed)
 Recreation Therapy Group Note   Group Topic:Goal Setting  Group Date: 02/27/2024 Start Time: 1400 End Time: 1500 Facilitators: Celestia Jeoffrey BRAVO, LRT, CTRS Location: Craft Room  Group Description: Product/process development scientist. Patients were given many different magazines, a glue stick, markers, and a piece of cardstock paper. LRT and pts discussed the importance of having goals in life. LRT and pts discussed the difference between short-term and long-term goals, as well as what a SMART goal is. LRT encouraged pts to create a vision board, with images they picked and then cut out with safety scissors from the magazine, for themselves, that capture their short and long-term goals. LRT encouraged pts to show and explain their vision board to the group.   Goal Area(s) Addressed:  Patient will gain knowledge of short vs. long term goals.  Patient will identify goals for themselves. Patient will practice setting SMART goals.   Affect/Mood: N/A   Participation Level: Did not attend    Clinical Observations/Individualized Feedback: Patient was not on the unit at the time of group.   Plan: Continue to engage patient in RT group sessions 2-3x/week.   Jeoffrey BRAVO Celestia, LRT, CTRS 02/27/2024 4:50 PM

## 2024-02-27 NOTE — ED Notes (Signed)
 Poison control returned call at this time.  Reports case has been closed out after recent lab results

## 2024-02-27 NOTE — ED Notes (Signed)
 Patient completing TTS evaluation at this time

## 2024-02-27 NOTE — Inpatient Diabetes Management (Signed)
 Inpatient Diabetes Program Recommendations  AACE/ADA: New Consensus Statement on Inpatient Glycemic Control (2015)  Target Ranges:  Prepandial:   less than 140 mg/dL      Peak postprandial:   less than 180 mg/dL (1-2 hours)      Critically ill patients:  140 - 180 mg/dL   Lab Results  Component Value Date   GLUCAP 260 (H) 02/27/2024   HGBA1C 10.9 (H) 11/24/2023    Review of Glycemic Control  Diabetes history: DM2 Outpatient Diabetes medications: glyburide  10 bid, metformin  1000 mg BID,Trulicity  0.75 weekly, Basaglar  50 daily Current orders for Inpatient glycemic control: glyburide  10 BID, Semglee  50 units daily, metformin  1000 mg BID  Inpatient Diabetes Program Recommendations:    Consider adding Novolog  0-15 TID with meals and 0-5 HS  HgbA1C to assess glycemic control prior to admission  Continue to follow.  Thank you. Shona Brandy, RD, LDN, CDCES Inpatient Diabetes Coordinator [phone]

## 2024-02-28 ENCOUNTER — Encounter: Payer: MEDICAID | Admitting: Student

## 2024-02-28 DIAGNOSIS — E782 Mixed hyperlipidemia: Secondary | ICD-10-CM

## 2024-02-28 DIAGNOSIS — E1169 Type 2 diabetes mellitus with other specified complication: Secondary | ICD-10-CM

## 2024-02-28 DIAGNOSIS — F259 Schizoaffective disorder, unspecified: Secondary | ICD-10-CM

## 2024-02-28 LAB — GLUCOSE, CAPILLARY: Glucose-Capillary: 182 mg/dL — ABNORMAL HIGH (ref 70–99)

## 2024-02-28 MED ORDER — HYDROXYZINE HCL 25 MG PO TABS
25.0000 mg | ORAL_TABLET | Freq: Four times a day (QID) | ORAL | Status: DC | PRN
  Administered 2024-02-28 – 2024-03-01 (×3): 25 mg via ORAL
  Filled 2024-02-28 (×3): qty 1

## 2024-02-28 MED ORDER — PALIPERIDONE ER 3 MG PO TB24
6.0000 mg | ORAL_TABLET | Freq: Every day | ORAL | Status: DC
  Administered 2024-02-29 – 2024-03-04 (×5): 6 mg via ORAL
  Filled 2024-02-28 (×6): qty 2

## 2024-02-28 NOTE — Group Note (Signed)
 Date:  02/28/2024 Time:  7:01 PM  Group Topic/Focus:  Activity Group: The focus of the group is to promote activity for the patients and to encourage them to go outside to the courtyard and get some fresh air and some exercise.    Participation Level:  Did Not Attend   Camellia HERO Wisdom Seybold 02/28/2024, 7:01 PM

## 2024-02-28 NOTE — Group Note (Signed)
 Recreation Therapy Group Note   Group Topic:Healthy Support Systems  Group Date: 02/28/2024 Start Time: 1000 End Time: 1050 Facilitators: Celestia Jeoffrey BRAVO, LRT, CTRS Location: Craft Room  Group Description: Straw Bridge. Individually, patients were given 10 plastic drinking straws and an equal length of masking tape. Using the materials provided, patients were instructed to build a free-standing bridge-like structure to suspend an everyday item (ex: puzzle box) off the floor or table surface. All materials were required to be used in Secondary school teacher. LRT facilitated post-activity discussion reviewing how we, humans, are like the structure we built; when things get too heavy in our life and we do not have adequate supports/coping skills, then we will fall just like the straw-built structure will. LRT focused on how having a "base" or structure on the bottom was necessary for the object to stand, meaning we must be secure and stable first before building on ourselves or others. Patients were encouraged to name 2 healthy supports in their life and reflect on how the skills used in this activity can be generalized to daily life post discharge.  Goal Area(s) Addressed:  Patient will identify two healthy support systems in their life. Patient will work on Product manager. Patient will verbalize the importance of having a strong and steady "base".  Patient will follow multi-step directions. Patients will engage in creativity and use all provided materials.   Affect/Mood: N/A   Participation Level: Did not attend    Clinical Observations/Individualized Feedback: Patient did not attend group.   Plan: Continue to engage patient in RT group sessions 2-3x/week.   Jeoffrey BRAVO Celestia, LRT, CTRS 02/28/2024 1:10 PM

## 2024-02-28 NOTE — [facility] Suicide Risk Assessment (Signed)
 [facility] INPATIENT:  Family/Significant Other Suicide Prevention Education  Suicide Prevention Education:  Patient Refusal for Family/Significant Other Suicide Prevention Education: The patient Leon Ross has refused to provide written consent for family/significant other to be provided Family/Significant Other Suicide Prevention Education during admission and/or prior to discharge.  Physician notified.  SPE completed with pt, as pt refused to consent to family contact. SPI pamphlet provided to pt and pt was encouraged to share information with support network, ask questions, and talk about any concerns relating to SPE. Pt denies access to guns/firearms and verbalized understanding of information provided. Mobile Crisis information also provided to pt.    [name] 02/28/2024, 9:55 AM

## 2024-02-28 NOTE — [facility] Counselor (Signed)
 CSW attempted to touch base with [employer] at [phone] to assess patient's ability to return to discharge.   [employer] was unable to make contact.   CSW left HIPAA compliant voicemail. CSW to continue to assess.   Mariel Gaudin, MSW, LCSWA 02/28/2024 1:45 PM
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
## 2024-02-28 NOTE — Plan of Care (Signed)
  Problem: Education: Goal: Mental status will improve Outcome: Progressing Goal: Verbalization of understanding the information provided will improve Outcome: Progressing   Problem: Activity: Goal: Interest or engagement in activities will improve Outcome: Progressing Goal: Sleeping patterns will improve Outcome: Progressing   Problem: Physical Regulation: Goal: Ability to maintain clinical measurements within normal limits will improve Outcome: Progressing   Problem: Safety: Goal: Periods of time without injury will increase Outcome: Progressing

## 2024-02-28 NOTE — Group Note (Signed)
 [facility] LCSW Group Therapy Note   Group Date: 02/28/2024 Start Time: 1300 End Time: 1400  Type of Therapy/Topic:  Group Therapy:  Feelings about Diagnosis  Participation Level:  Did Not Attend    Description of Group:    This group will allow patients to explore their thoughts and feelings about diagnoses they have received. Patients will be guided to explore their level of understanding and acceptance of these diagnoses. Facilitator will encourage patients to process their thoughts and feelings about the reactions of others to their diagnosis, and will guide patients in identifying ways to discuss their diagnosis with significant others in their lives. This group will be process-oriented, with patients participating in exploration of their own experiences as well as giving and receiving support and challenge from other group members.   Therapeutic Goals: 1. Patient will demonstrate understanding of diagnosis as evidence by identifying two or more symptoms of the disorder:  2. Patient will be able to express two feelings regarding the diagnosis 3. Patient will demonstrate ability to communicate their needs through discussion and/or role plays  Summary of Patient Progress: X   Therapeutic Modalities:   Cognitive Behavioral Therapy Brief Therapy Feelings Identification    Nadara JONELLE Fam, LCSW
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
## 2024-02-28 NOTE — Group Note (Signed)
 Date:  02/28/2024 Time:  6:21 PM  Group Topic/Focus:  Making Healthy Choices:   The focus of this group is to help patients identify negative/unhealthy choices they were using prior to admission and identify positive/healthier coping strategies to replace them upon discharge.    Participation Level:  Active  Participation Quality:  Appropriate  Affect:  Appropriate  Cognitive:  Appropriate  Insight: Appropriate  Engagement in Group:  Engaged  Modes of Intervention:  Activity  Additional Comments:    Camellia HERO Zannie Locastro 02/28/2024, 6:21 PM

## 2024-02-28 NOTE — Plan of Care (Signed)
   Problem: Education: Goal: Emotional status will improve Outcome: Progressing Goal: Mental status will improve Outcome: Progressing

## 2024-02-28 NOTE — Group Note (Signed)
 Date:  02/28/2024 Time:  5:22 AM  Group Topic/Focus:  Making Healthy Choices:   The focus of this group is to help patients identify negative/unhealthy choices they were using prior to admission and identify positive/healthier coping strategies to replace them upon discharge. Managing Feelings:   The focus of this group is to identify what feelings patients have difficulty handling and develop a plan to handle them in a healthier way upon discharge. Self Care:   The focus of this group is to help patients understand the importance of self-care in order to improve or restore emotional, physical, spiritual, interpersonal, and financial health.    Participation Level:  Active  Participation Quality:  Appropriate  Affect:  Appropriate  Cognitive:  Appropriate and Oriented  Insight: Appropriate  Engagement in Group:  Engaged  Modes of Intervention:  Discussion and Support  Additional Comments:  N/A  [name] 02/28/2024, 5:22 AM

## 2024-02-28 NOTE — Progress Notes (Signed)
 PT calm and pleasant during assessment denying SI/HI/AVH. Pt stated he was tired and just wanted to sleep. Pt didn't have any medication scheduled tonight and hasn't requested anything PRN as of now. Pt given education, support, and encouragement to be active in his treatment plan. Pt being monitored Q 15 minutes for safety per unit protocol, remains safe on the unit

## 2024-02-28 NOTE — Progress Notes (Signed)
   02/28/24 1100  Psych Admission Type (Psych Patients Only)  Admission Status Voluntary  Psychosocial Assessment  Patient Complaints Anxiety  Eye Contact Fair  Facial Expression Sad  Affect Appropriate to circumstance  Speech Logical/coherent  Interaction Assertive  Motor Activity Slow  Appearance/Hygiene Unremarkable  Behavior Characteristics Cooperative  Mood Anxious;Depressed  Thought Process  Coherency WDL  Content WDL  Delusions None reported or observed  Perception WDL  Hallucination None reported or observed  Judgment Impaired  Confusion None  Danger to Self  Current suicidal ideation? Denies  Agreement Not to Harm Self Yes  Description of Agreement verbal  Danger to Others  Danger to Others None reported or observed   Patient stated that Vistaril  helped with his anxiety. Patient appears with a sad affect. Attended groups.Minimal interactions with peers. Support and encouragement given.

## 2024-02-29 LAB — GLUCOSE, CAPILLARY: Glucose-Capillary: 287 mg/dL — ABNORMAL HIGH (ref 70–99)

## 2024-02-29 NOTE — Progress Notes (Signed)
   02/29/24 2000  Psych Admission Type (Psych Patients Only)  Admission Status Voluntary  Psychosocial Assessment  Patient Complaints Anxiety  Eye Contact Fair  Facial Expression Sad  Affect Appropriate to circumstance  Speech Logical/coherent  Interaction Assertive  Motor Activity Slow  Appearance/Hygiene Unremarkable  Behavior Characteristics Cooperative;Appropriate to situation  Mood Depressed  Thought Process  Coherency WDL  Content WDL  Delusions None reported or observed  Perception WDL  Hallucination None reported or observed  Judgment Impaired  Confusion None  Danger to Self  Current suicidal ideation? Denies  Agreement Not to Harm Self Yes  Description of Agreement verbal  Danger to Others  Danger to Others None reported or observed

## 2024-02-29 NOTE — Group Note (Signed)
 Date:  02/29/2024 Time:  8:32 PM  Group Topic/Focus:  Orientation:   The focus of this group is to educate the patient on the purpose and policies of crisis stabilization and provide a format to answer questions about their admission.  The group details unit policies and expectations of patients while admitted.    Participation Level:  Active  Participation Quality:  Appropriate and Attentive  Affect:  Appropriate  Cognitive:  Alert and Appropriate  Insight: Good  Engagement in Group:  Engaged and Improving  Modes of Intervention:  Clarification, Discussion, Education, Orientation, Rapport Building, and Support  Additional Comments:     Demoni Parmar 02/29/2024, 8:32 PM
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
## 2024-02-29 NOTE — Group Note (Signed)
 Date:  02/29/2024 Time:  4:38 PM  Group Topic/Focus:  Wellness Toolbox:   The focus of this group is to discuss various aspects of wellness, balancing those aspects and exploring ways to increase the ability to experience wellness.  Patients will create a wellness toolbox for use upon discharge.    Participation Level:  Minimal  Participation Quality:  Appropriate  Affect:  Appropriate  Cognitive:  Appropriate  Insight: Appropriate  Engagement in Group:  Engaged  Modes of Intervention:  Activity and Socialization  Additional Comments:    Deitra Caron Mainland 02/29/2024, 4:38 PM

## 2024-02-29 NOTE — Plan of Care (Signed)
   Problem: Education: Goal: Emotional status will improve Outcome: Progressing Goal: Mental status will improve Outcome: Progressing

## 2024-02-29 NOTE — Group Note (Signed)
 [facility] LCSW Group Therapy Note   Group Date: 02/29/2024 Start Time: 1300 End Time: 1400   Type of Therapy/Topic:  Group Therapy:  Emotion Regulation  Participation Level:  Active   Mood:  Description of Group:    The purpose of this group is to assist patients in learning to regulate negative emotions and experience positive emotions. Patients will be guided to discuss ways in which they have been vulnerable to their negative emotions. These vulnerabilities will be juxtaposed with experiences of positive emotions or situations, and patients challenged to use positive emotions to combat negative ones. Special emphasis will be placed on coping with negative emotions in conflict situations, and patients will process healthy conflict resolution skills.  Therapeutic Goals: Patient will identify two positive emotions or experiences to reflect on in order to balance out negative emotions:  Patient will label two or more emotions that they find the most difficult to experience:  Patient will be able to demonstrate positive conflict resolution skills through discussion or role plays:   Summary of Patient Progress:   The patient actively engaged in group openly sharing their experiences navigating complex situations that require emotional regulation. With the facilitator, the patient explored cognitive distortions and reflected on how distorted thinking may have influenced their feelings and behaviors. Together with peers and facilitator, the patient brainstormed strategies to challenge these distortions and work toward more positive outcomes. The patient was receptive to feedback from both peers and facilitator and provided thoughtful, honest feedback to others.     Therapeutic Modalities:   Cognitive Behavioral Therapy Feelings Identification Dialectical Behavioral Therapy   [name], LCSW

## 2024-02-29 NOTE — Progress Notes (Signed)
   02/29/24 1000  Psych Admission Type (Psych Patients Only)  Admission Status Voluntary  Psychosocial Assessment  Patient Complaints Anxiety  Eye Contact Fair  Facial Expression Sad  Affect Appropriate to circumstance  Speech Logical/coherent  Interaction Assertive  Motor Activity Slow  Appearance/Hygiene Unremarkable  Behavior Characteristics Cooperative;Appropriate to situation  Mood Depressed  Thought Process  Coherency WDL  Content WDL  Delusions None reported or observed  Perception WDL  Hallucination None reported or observed  Judgment Impaired  Confusion None  Danger to Self  Current suicidal ideation? Denies  Agreement Not to Harm Self Yes  Description of Agreement verbal  Danger to Others  Danger to Others None reported or observed   Patient stated that Vistaril  helps with his anxiety. Patient isolates to his room reading. States that helps with his anxiety. ADLs maintained. Support and encouragement given.

## 2024-02-29 NOTE — [facility] Counselor (Signed)
 CSW touched base with [employer] [facility] [employer] at [phone] to assess patient's ability to return to at discharge.  Staff reported that the patient CAN return at discharge.   This has been communicated to patient and team.  CSW to continue to assess.   Janyth Riera, MSW, LCSWA 02/29/2024 12:43 PM

## 2024-02-29 NOTE — BH IP Treatment Plan (Signed)
 Interdisciplinary Treatment and Diagnostic Plan Update  02/29/2024 Time of Session: 10:16 AM Leon Ross MRN: [id number]  Principal Diagnosis: MDD (major depressive disorder)  Secondary Diagnoses: Principal Problem:   MDD (major depressive disorder)   Current Medications:  Current Facility-Administered Medications  Medication Dose Route Frequency Provider Last Rate Last Admin   acetaminophen  (TYLENOL ) tablet 650 mg  650 mg Oral Q6H PRN Motley-Mangrum, Jadeka A, PMHNP       alum & mag hydroxide-simeth (MAALOX/MYLANTA) 200-200-20 MG/5ML suspension 30 mL  30 mL Oral Q4H PRN Motley-Mangrum, Jadeka A, PMHNP       haloperidol  (HALDOL ) tablet 5 mg  5 mg Oral TID PRN Motley-Mangrum, Jadeka A, PMHNP       And   diphenhydrAMINE  (BENADRYL ) capsule 50 mg  50 mg Oral TID PRN Motley-Mangrum, Jadeka A, PMHNP       haloperidol  lactate (HALDOL ) injection 5 mg  5 mg Intramuscular TID PRN Motley-Mangrum, Jadeka A, PMHNP       And   diphenhydrAMINE  (BENADRYL ) injection 50 mg  50 mg Intramuscular TID PRN Motley-Mangrum, Jadeka A, PMHNP       And   LORazepam  (ATIVAN ) injection 2 mg  2 mg Intramuscular TID PRN Motley-Mangrum, Jadeka A, PMHNP       haloperidol  lactate (HALDOL ) injection 10 mg  10 mg Intramuscular TID PRN Motley-Mangrum, Jadeka A, PMHNP       And   diphenhydrAMINE  (BENADRYL ) injection 50 mg  50 mg Intramuscular TID PRN Motley-Mangrum, Jadeka A, PMHNP       And   LORazepam  (ATIVAN ) injection 2 mg  2 mg Intramuscular TID PRN Motley-Mangrum, Jadeka A, PMHNP       glyBURIDE  (DIABETA ) tablet 10 mg  10 mg Oral BID Motley-Mangrum, Jadeka A, PMHNP   10 mg at 02/29/24 0816   hydrOXYzine  (ATARAX ) tablet 25 mg  25 mg Oral Q6H PRN [name], NP   25 mg at 02/29/24 0815   insulin  glargine-yfgn (SEMGLEE ) injection 50 Units  50 Units Subcutaneous Daily Motley-Mangrum, Jadeka A, PMHNP   50 Units at 02/29/24 1023   magnesium  hydroxide (MILK OF MAGNESIA) suspension 30 mL  30 mL Oral Daily PRN  Motley-Mangrum, Jadeka A, PMHNP       metFORMIN  (GLUCOPHAGE ) tablet 1,000 mg  1,000 mg Oral BID WC Motley-Mangrum, Jadeka A, PMHNP   1,000 mg at 02/29/24 0816   paliperidone  (INVEGA ) 24 hr tablet 6 mg  6 mg Oral Daily [name], NP   6 mg at 02/29/24 0815   sertraline  (ZOLOFT ) tablet 150 mg  150 mg Oral Daily Motley-Mangrum, Jadeka A, PMHNP   150 mg at 02/29/24 0816   PTA Medications: Medications Prior to Admission  Medication Sig Dispense Refill Last Dose/Taking   albuterol  (VENTOLIN  HFA) 108 (90 Base) MCG/ACT inhaler Inhale 2 puffs into the lungs every 6 (six) hours as needed for wheezing or shortness of breath. 18 g 2    atorvastatin  (LIPITOR) 10 MG tablet Take 1 tablet (10 mg total) by mouth daily. 90 tablet 3    benzonatate  (TESSALON ) 100 MG capsule Take 1 capsule (100 mg total) by mouth 3 (three) times daily as needed for cough. (Patient not taking: Reported on 02/27/2024) 30 capsule 0    Dulaglutide  (TRULICITY ) 0.75 MG/0.5ML SOAJ Inject 0.75 mg into the skin once a week. 2 mL 1    glyBURIDE  (DIABETA ) 5 MG tablet Take 2 tablets (10 mg total) by mouth 2 (two) times daily. 60 tablet 2    hydrOXYzine  (VISTARIL )  25 MG capsule Take 1 capsule (25 mg total) by mouth every 6 (six) hours as needed. (Patient taking differently: Take 25 mg by mouth every 6 (six) hours as needed for anxiety.) 60 capsule 1    Insulin  Glargine (BASAGLAR  KWIKPEN) 100 UNIT/ML Inject 50 Units into the skin daily. 18 mL 0    Insulin  Pen Needle (HEALTHY ACCENTS UNIFINE PENTIP) 31G X 6 MM MISC Inject 1 pen  into the skin daily. 100 each 0    metFORMIN  (GLUCOPHAGE ) 1000 MG tablet Take 1 tablet (1,000 mg total) by mouth 2 (two) times daily. 180 tablet 0    paliperidone  (INVEGA ) 3 MG 24 hr tablet Take 1 tablet (3 mg total) by mouth daily. 30 tablet 0    sertraline  (ZOLOFT ) 50 MG tablet Take 3 tablets (150 mg total) by mouth daily. 180 tablet 0     Patient Stressors: Financial difficulties    Patient Strengths: Work  Customer service manager Modalities: Medication Management, Group therapy, Case management,  1 to 1 session with clinician, Psychoeducation, Recreational therapy.   Physician Treatment Plan for Primary Diagnosis: MDD (major depressive disorder) Long Term Goal(s): Improvement in symptoms so as ready for discharge   Short Term Goals: Ability to identify changes in lifestyle to reduce recurrence of condition will improve  Medication Management: Evaluate patient's response, side effects, and tolerance of medication regimen.  Therapeutic Interventions: 1 to 1 sessions, Unit Group sessions and Medication administration.  Evaluation of Outcomes: Not Met  Physician Treatment Plan for Secondary Diagnosis: Principal Problem:   MDD (major depressive disorder)  Long Term Goal(s): Improvement in symptoms so as ready for discharge   Short Term Goals: Ability to identify changes in lifestyle to reduce recurrence of condition will improve     Medication Management: Evaluate patient's response, side effects, and tolerance of medication regimen.  Therapeutic Interventions: 1 to 1 sessions, Unit Group sessions and Medication administration.  Evaluation of Outcomes: Not Met   RN Treatment Plan for Primary Diagnosis: MDD (major depressive disorder) Long Term Goal(s): Knowledge of disease and therapeutic regimen to maintain health will improve  Short Term Goals: Ability to verbalize frustration and anger appropriately will improve, Ability to demonstrate self-control, Ability to participate in decision making will improve, Ability to verbalize feelings will improve, Ability to disclose and discuss suicidal ideas, and Ability to identify and develop effective coping behaviors will improve  Medication Management: RN will administer medications as ordered by provider, will assess and evaluate patient's response and provide education to patient for prescribed medication. RN will report any adverse and/or side  effects to prescribing provider.  Therapeutic Interventions: 1 on 1 counseling sessions, Psychoeducation, Medication administration, Evaluate responses to treatment, Monitor vital signs and CBGs as ordered, Perform/monitor CIWA, COWS, AIMS and Fall Risk screenings as ordered, Perform wound care treatments as ordered.  Evaluation of Outcomes: Not Met   LCSW Treatment Plan for Primary Diagnosis: MDD (major depressive disorder) Long Term Goal(s): Safe transition to appropriate next level of care at discharge, Engage patient in therapeutic group addressing interpersonal concerns.  Short Term Goals: Engage patient in aftercare planning with referrals and resources, Increase social support, Increase ability to appropriately verbalize feelings, Increase emotional regulation, Facilitate acceptance of mental health diagnosis and concerns, Facilitate patient progression through stages of change regarding substance use diagnoses and concerns, Identify triggers associated with mental health/substance abuse issues, and Increase skills for wellness and recovery  Therapeutic Interventions: Assess for all discharge needs, 1 to 1 time with Child psychotherapist,  Explore available resources and support systems, Assess for adequacy in community support network, Educate family and significant other(s) on suicide prevention, Complete Psychosocial Assessment, Interpersonal group therapy.  Evaluation of Outcomes: Not Met   Progress in Treatment: Attending groups: Yes. and No. Participating in groups: Yes. and No. Taking medication as prescribed: Yes. Toleration medication: Yes. Family/Significant other contact made: No, will contact:  Patient Refusal for Family/Significant Other Suicide Prevention Education: The patient Shaw Dobek has refused to provide written consent for family/significant other to be provided Family/Significant Other Suicide Prevention Education during admission and/or prior to discharge.   Physician notified. Patient understands diagnosis: Yes. Discussing patient identified problems/goals with staff: Yes. Medical problems stabilized or resolved: Yes. Denies suicidal/homicidal ideation: Yes. Issues/concerns per patient self-inventory: No. Other: None  New problem(s) identified: No, Describe:  None  New Short Term/Long Term Goal(s):detox, elimination of symptoms of psychosis, medication management for mood stabilization; elimination of SI thoughts; development of comprehensive mental wellness/sobriety plan.    Patient Goals:  To get stable on my medication and get back in my head space.   Discharge Plan or Barriers: CSW to assist with the development of appropriate discharge plan.    Reason for Continuation of Hospitalization: Anxiety Depression Suicidal ideation  Estimated Length of Stay: 1-7 days.   Last 3 [place] Suicide Severity Risk Score: Flowsheet Row Admission (Current) from 02/27/2024 in [facility] ED from 02/26/2024 in [facility] at [facility] from 02/03/2024 in [facility] at [facility] RISK CATEGORY No Risk No Risk No Risk    Last [facility] 2/9 Scores:    01/26/2024    3:54 PM 11/24/2023    9:28 AM  Depression screen PHQ 2/9  Decreased Interest 3 3  Down, Depressed, Hopeless 3 3  PHQ - 2 Score 6 6  Altered sleeping 3 1  Tired, decreased energy 2 1  Change in appetite 2 1  Feeling bad or failure about yourself  3 3  Trouble concentrating 0 1  Moving slowly or fidgety/restless 1 1  Suicidal thoughts  1  PHQ-9 Score 17 15  Difficult doing work/chores  Very difficult    Scribe for Treatment Team: [name], LCSW 02/29/2024 11:14 AM

## 2024-02-29 NOTE — Plan of Care (Signed)
   Problem: Education: Goal: Emotional status will improve Outcome: Progressing Goal: Mental status will improve Outcome: Progressing   Problem: Safety: Goal: Periods of time without injury will increase Outcome: Progressing

## 2024-02-29 NOTE — Group Note (Signed)
 Date:  02/29/2024 Time:  11:18 AM  Group Topic/Focus:  Goals Group:   The focus of this group is to help patients establish daily goals to achieve during treatment and discuss how the patient can incorporate goal setting into their daily lives to aide in recovery.    Participation Level:  Did Not Attend   Deitra Clap [facility] 02/29/2024, 11:18 AM

## 2024-02-29 NOTE — Plan of Care (Signed)
  Problem: Education: Goal: Emotional status will improve Outcome: Progressing Goal: Mental status will improve Outcome: Progressing   Problem: Activity: Goal: Interest or engagement in activities will improve Outcome: Progressing Goal: Sleeping patterns will improve Outcome: Progressing   Problem: Physical Regulation: Goal: Ability to maintain clinical measurements within normal limits will improve Outcome: Progressing

## 2024-03-01 LAB — HEMOGLOBIN A1C
Hgb A1c MFr Bld: 8.7 % — ABNORMAL HIGH (ref 4.8–5.6)
Mean Plasma Glucose: 202.99 mg/dL

## 2024-03-01 LAB — LIPID PANEL
Cholesterol: 173 mg/dL (ref 0–200)
HDL: 32 mg/dL — ABNORMAL LOW (ref >40)
LDL Cholesterol: 72 mg/dL (ref 0–99)
Total CHOL/HDL Ratio: 5.4 ratio
Triglycerides: 344 mg/dL — ABNORMAL HIGH (ref <150)
VLDL: 69 mg/dL — ABNORMAL HIGH (ref 0–40)

## 2024-03-01 LAB — GLUCOSE, CAPILLARY: Glucose-Capillary: 139 mg/dL — ABNORMAL HIGH (ref 70–99)

## 2024-03-01 MED ORDER — TRAZODONE HCL 50 MG PO TABS
50.0000 mg | ORAL_TABLET | Freq: Once | ORAL | Status: AC
  Administered 2024-03-01: 50 mg via ORAL

## 2024-03-01 NOTE — Group Note (Signed)
 Date:  03/01/2024 Time:  10:02 AM  Group Topic/Focus:  Goals Group:   The focus of this group is to help patients establish daily goals to achieve during treatment and discuss how the patient can incorporate goal setting into their daily lives to aide in recovery.     Leon Ross 03/01/2024, 10:02 AM

## 2024-03-01 NOTE — Progress Notes (Signed)
   03/01/24 1000  Psych Admission Type (Psych Patients Only)  Admission Status Voluntary  Psychosocial Assessment  Patient Complaints Anxiety;Depression  Eye Contact Fair  Facial Expression Sad  Affect Sad  Speech Logical/coherent  Interaction Assertive  Motor Activity Other (Comment) (appropriate for developmental age)  Appearance/Hygiene Unremarkable  Behavior Characteristics Cooperative  Mood Pleasant;Sad (Patient writes his goal for today is going to groups and getting rest. He writes that he will go to groups to help him reach his goal.)  Thought Process  Coherency WDL  Content WDL  Delusions None reported or observed  Perception Hallucinations  Hallucination Auditory (Patient reports that he is having auditory hallucinations that tell him derogatory comments. Patient denies command hallucinations.)  Judgment Impaired  Confusion None  Danger to Self  Current suicidal ideation? Denies  Danger to Others  Danger to Others None reported or observed

## 2024-03-01 NOTE — Group Note (Signed)
 Date:  03/01/2024 Time:  8:31 PM  Group Topic/Focus:  Self Care:   The focus of this group is to help patients understand the importance of self-care in order to improve or restore emotional, physical, spiritual, interpersonal, and financial health.    Participation Level:  Minimal  Participation Quality:  Appropriate  Affect:  Appropriate  Cognitive:  Appropriate  Insight: Appropriate  Engagement in Group:  Limited  Modes of Intervention:  Discussion and Support  Additional Comments:     Kerri Katz 03/01/2024, 8:31 PM

## 2024-03-01 NOTE — Plan of Care (Signed)
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
## 2024-03-01 NOTE — Group Note (Signed)
 LCSW Group Therapy Note  Group Date: 03/01/2024 Start Time: 1310 End Time: 1400   Type of Therapy and Topic:  Group Therapy - How To Cope with Nervousness about Discharge   Participation Level:  None   Description of Group This process group involved identification of patients' feelings about discharge. Some of them are scheduled to be discharged soon, while others are new admissions, but each of them was asked to share thoughts and feelings surrounding discharge from the hospital. One common theme was that they are excited at the prospect of going home, while another was that many of them are apprehensive about sharing why they were hospitalized. Patients were given the opportunity to discuss these feelings with their peers in preparation for discharge.  Therapeutic Goals  Patient will identify their overall feelings about pending discharge. Patient will think about how they might proactively address issues that they believe will once again arise once they get home (i.e. with parents). Patients will participate in discussion about having hope for change.   Summary of Patient Progress:   Patient was present in group.  Patient appeared attentive though did not engage in group discussion.    Therapeutic Modalities Cognitive Behavioral Therapy   Sherryle JINNY Margo, LCSW 03/01/2024  3:04 PM

## 2024-03-01 NOTE — BHH Counselor (Signed)
 CSW touched base with the [employer] [facility] [employer] to engage in safe discharge planning.   With the patient's tentative discharge date of Sunday, CSW spoke with staff to ensure that patient will be able to access the shelter over the weekend.  Staff member confirmed that patient can return over the weekend as long as he arrives before 8 PM.   This has been communicated to patient and team.   CSW to continue to assess.   Shacarra Choe, MSW, LCSWA 03/01/2024 2:52 PM

## 2024-03-01 NOTE — Group Note (Signed)
 Recreation Therapy Group Note   Group Topic:Leisure Education  Group Date: 03/01/2024 Start Time: 1000 End Time: 1050 Facilitators: Celestia Jeoffrey BRAVO, LRT, CTRS Location: Courtyard  Group Description: Music. Patients encouraged to name their favorite song(s) for LRT to play song through speaker for group to hear, while in the courtyard getting fresh air and sunlight. Patients educated on the definition of leisure and the importance of having different leisure interests outside of the hospital. Group discussed how leisure activities can often be used as Pharmacologist and that listening to music and being outside are examples.    Goal Area(s) Addressed:  Patient will identify a current leisure interest.  Patient will practice making a positive decision. Patient will have the opportunity to try a new leisure activity.   Affect/Mood: N/A   Participation Level: Did not attend    Clinical Observations/Individualized Feedback: Patient did not attend group.   Plan: Continue to engage patient in RT group sessions 2-3x/week.   [address], LRT, CTRS 03/01/2024 1:22 PM

## 2024-03-01 NOTE — Group Note (Signed)
 Date:  03/01/2024 Time:  7:11 PM  Group Topic/Focus:  Activity Group: The focus of the group is to promote activity for the patients and encourage them to go outside to the courtyard and get some fresh air and some exercise.    Participation Level:  Active  Participation Quality:  Appropriate  Affect:  Appropriate  Cognitive:  Appropriate  Insight: Appropriate  Engagement in Group:  Engaged  Modes of Intervention:  Activity  Additional Comments:    Camellia HERO Aldonia Keeven 03/01/2024, 7:11 PM

## 2024-03-01 NOTE — Progress Notes (Signed)
 PT calm and pleasant during assessment denying SI/HI/AVH. Pt stated he was tired and just wanted to sleep. Pt compliant with medication administration per MD orders. Pt given education, support, and encouragement to be active in his treatment plan. Pt being monitored Q 15 minutes for safety per unit protocol, remains safe on the unit.

## 2024-03-01 NOTE — Inpatient Diabetes Management (Addendum)
 Inpatient Diabetes Program Recommendations  AACE/ADA: New Consensus Statement on Inpatient Glycemic Control (2015)  Target Ranges:  Prepandial:   less than 140 mg/dL      Peak postprandial:   less than 180 mg/dL (1-2 hours)      Critically ill patients:  140 - 180 mg/dL   Lab Results  Component Value Date   GLUCAP 287 (H) 02/29/2024   HGBA1C 8.7 (H) 03/01/2024    Review of Glycemic Control  Latest Reference Range & Units 02/27/24 20:09 02/28/24 07:39 02/29/24 07:38  Glucose-Capillary 70 - 99 mg/dL 857 (H) 817 (H) 712 (H)   Diabetes history: DM 2 Outpatient Diabetes medications:  Trulicity  0.75 mg weekly Glyburide  10 mg bid Basaglar  50 units daily Metformin  1000 mg bid Current orders for Inpatient glycemic control:  Semglee  50 units daily Metformin  1000 mg bid Diabeta  10 mg bid  Inpatient Diabetes Program Recommendations:    Please add CBG's at least tid with meals to assess glycemic control and response to both insulin  and oral DM medications while in the hospital.   Thanks, Randall Bullocks, RN, BC-ADM Inpatient Diabetes Coordinator Pager [phone]  (8a-5p)

## 2024-03-02 LAB — GLUCOSE, CAPILLARY
Glucose-Capillary: 142 mg/dL — ABNORMAL HIGH (ref 70–99)
Glucose-Capillary: 233 mg/dL — ABNORMAL HIGH (ref 70–99)
Glucose-Capillary: 276 mg/dL — ABNORMAL HIGH (ref 70–99)

## 2024-03-02 MED ORDER — SERTRALINE HCL 100 MG PO TABS
200.0000 mg | ORAL_TABLET | Freq: Every day | ORAL | Status: DC
  Administered 2024-03-03 – 2024-03-04 (×2): 200 mg via ORAL
  Filled 2024-03-02 (×2): qty 2

## 2024-03-02 MED ORDER — TRAZODONE HCL 100 MG PO TABS
100.0000 mg | ORAL_TABLET | Freq: Every day | ORAL | Status: DC
  Administered 2024-03-02 – 2024-03-03 (×2): 100 mg via ORAL
  Filled 2024-03-02 (×2): qty 1

## 2024-03-02 NOTE — Group Note (Signed)
 Date:  03/02/2024 Time:  10:35 AM  Group Topic/Focus:  Goals Group:   The focus of this group is to help patients establish daily goals to achieve during treatment and discuss how the patient can incorporate goal setting into their daily lives to aide in recovery.    Participation Level:  Active  Participation Quality:  Appropriate  Affect:  Appropriate  Cognitive:  Appropriate  Insight: Appropriate  Engagement in Group:  Engaged  Modes of Intervention:  Activity and Discussion  Additional Comments:    Leon Ross [name] 03/02/2024, 10:35 AM

## 2024-03-02 NOTE — Plan of Care (Signed)
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
## 2024-03-02 NOTE — Progress Notes (Signed)
   03/02/24 0900  Psych Admission Type (Psych Patients Only)  Admission Status Voluntary  Psychosocial Assessment  Patient Complaints Anxiety;Depression (Patient reports his anxiety and depression is improving.)  Eye Contact Fair  Facial Expression Animated  Affect Appropriate to circumstance  Speech Logical/coherent  Interaction Assertive  Motor Activity Other (Comment) (appropriate for developmental age)  Appearance/Hygiene Unremarkable  Behavior Characteristics Cooperative  Mood Pleasant (Patient writes his goal for today is going to groups/taking meds. He writes he will stay awake to help meet that goal.)  Thought Process  Coherency WDL  Content WDL  Delusions None reported or observed  Perception Hallucinations  Hallucination Auditory (Patient reports the voices are faint and are decreasing in frequency.)  Judgment WDL  Confusion None  Danger to Self  Current suicidal ideation? Denies  Danger to Others  Danger to Others None reported or observed

## 2024-03-02 NOTE — Group Note (Signed)
 Date:  03/02/2024 Time:  10:28 PM  Group Topic/Focus:  Crisis Planning:   The purpose of this group is to help patients create a crisis plan for use upon discharge or in the future, as needed. Emotional Education:   The focus of this group is to discuss what feelings/emotions are, and how they are experienced. Managing Feelings:   The focus of this group is to identify what feelings patients have difficulty handling and develop a plan to handle them in a healthier way upon discharge.    Participation Level:  Minimal  Participation Quality:  Appropriate  Affect:  Appropriate  Cognitive:  Appropriate  Insight: Limited  Engagement in Group:  Supportive  Modes of Intervention:  Support  Additional Comments:  n/a  Cipriano Millikan L 03/02/2024, 10:28 PM

## 2024-03-03 LAB — GLUCOSE, CAPILLARY
Glucose-Capillary: 205 mg/dL — ABNORMAL HIGH (ref 70–99)
Glucose-Capillary: 259 mg/dL — ABNORMAL HIGH (ref 70–99)

## 2024-03-03 MED ORDER — SERTRALINE HCL 100 MG PO TABS: 200.0000 mg | ORAL_TABLET | Freq: Every day | ORAL | 0 refills | Status: AC

## 2024-03-03 MED ORDER — PALIPERIDONE ER 6 MG PO TB24: 6.0000 mg | ORAL_TABLET | Freq: Every day | ORAL | 0 refills | Status: AC

## 2024-03-03 MED ORDER — TRAZODONE HCL 100 MG PO TABS: 100.0000 mg | ORAL_TABLET | Freq: Every day | ORAL | 0 refills | Status: AC

## 2024-03-03 NOTE — Plan of Care (Signed)
  Problem: Education: Goal: Knowledge of [facility] Education information/materials will improve Outcome: Progressing   Problem: Education: Goal: Emotional status will improve Outcome: Progressing   Problem: Education: Goal: Mental status will improve Outcome: Progressing   Problem: Education: Goal: Verbalization of understanding the information provided will improve Outcome: Progressing   

## 2024-03-03 NOTE — Plan of Care (Signed)
  Problem: Education: Goal: Knowledge of [facility] Education information/materials will improve Outcome: Progressing Goal: Emotional status will improve Outcome: Progressing Goal: Mental status will improve Outcome: Progressing Goal: Verbalization of understanding the information provided will improve Outcome: Progressing   Problem: Activity: Goal: Interest or engagement in activities will improve Outcome: Progressing Goal: Sleeping patterns will improve Outcome: Progressing   Problem: Coping: Goal: Ability to verbalize frustrations and anger appropriately will improve Outcome: Progressing Goal: Ability to demonstrate self-control will improve Outcome: Progressing   Problem: Health Behavior/Discharge Planning: Goal: Identification of resources available to assist in meeting health care needs will improve Outcome: Progressing Goal: Compliance with treatment plan for underlying cause of condition will improve Outcome: Progressing   Problem: Physical Regulation: Goal: Ability to maintain clinical measurements within normal limits will improve Outcome: Progressing   Problem: Safety: Goal: Periods of time without injury will increase Outcome: Progressing   Problem: Education: Goal: Ability to describe self-care measures that may prevent or decrease complications (Diabetes Survival Skills Education) will improve Outcome: Progressing Goal: Individualized Educational Video(s) Outcome: Progressing   Problem: Coping: Goal: Ability to adjust to condition or change in health will improve Outcome: Progressing   Problem: Fluid Volume: Goal: Ability to maintain a balanced intake and output will improve Outcome: Progressing   Problem: Health Behavior/Discharge Planning: Goal: Ability to identify and utilize available resources and services will improve Outcome: Progressing Goal: Ability to manage health-related needs will improve Outcome: Progressing   Problem:  Metabolic: Goal: Ability to maintain appropriate glucose levels will improve Outcome: Progressing   Problem: Nutritional: Goal: Maintenance of adequate nutrition will improve Outcome: Progressing Goal: Progress toward achieving an optimal weight will improve Outcome: Progressing   Problem: Skin Integrity: Goal: Risk for impaired skin integrity will decrease Outcome: Progressing   Problem: Tissue Perfusion: Goal: Adequacy of tissue perfusion will improve Outcome: Progressing

## 2024-03-03 NOTE — Progress Notes (Signed)
   03/03/24 1300  Psych Admission Type (Psych Patients Only)  Admission Status Voluntary  Psychosocial Assessment  Patient Complaints Anxiety;Depression (patient states that he is worried about my daughter and I don't want to go back to the shelter.)  Eye Contact Fair  Facial Expression Anxious  Affect Anxious  Speech Logical/coherent  Interaction Assertive;Isolative (isolates to room, except for meals and medication)  Motor Activity Slow  Appearance/Hygiene Poor hygiene  Behavior Characteristics Cooperative;Appropriate to situation  Aggressive Behavior  Effect No apparent injury  Thought Process  Coherency WDL  Delusions None reported or observed  Perception WDL  Hallucination None reported or observed  Judgment WDL  Confusion None  Danger to Self  Current suicidal ideation? Denies  Agreement Not to Harm Self Yes  Description of Agreement Verbal  Danger to Others  Danger to Others Reported or observed   Patient's goal for today, per his self-inventory is getting ready for discharge, in which he will pay attention in order to achieve his goal.

## 2024-03-03 NOTE — Group Note (Signed)
 Date:  03/03/2024 Time:  12:14 PM  Group Topic/Focus:  Rediscovering Joy:   The focus of this group is to explore various ways to relieve stress in a positive manner.    Participation Level:  Did Not Attend   Camellia HERO Mishka Stegemann 03/03/2024, 12:14 PM
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
## 2024-03-03 NOTE — Group Note (Signed)
 Date:  03/03/2024 Time:  9:00 PM  Group Topic/Focus:  Self Care:   The focus of this group is to help patients understand the importance of self-care in order to improve or restore emotional, physical, spiritual, interpersonal, and financial health.    Participation Level:  Active  Participation Quality:  Appropriate  Affect:  Appropriate  Cognitive:  Appropriate  Insight: Appropriate  Engagement in Group:  Engaged  Modes of Intervention:  Discussion  Additional Comments:    Leon Ross 03/03/2024, 9:00 PM

## 2024-03-03 NOTE — Group Note (Signed)
 [facility] LCSW Group Therapy Note   Group Date: 03/03/2024 Start Time: 1325 End Time: 1445   Type of Therapy/Topic:  Group Therapy:  Balance in Life  Participation Level:  Did Not Attend   Description of Group:    This group will address the concept of balance and how it feels and looks when one is unbalanced. Patients will be encouraged to process areas in their lives that are out of balance, and identify reasons for remaining unbalanced. Facilitators will guide patients utilizing problem- solving interventions to address and correct the stressor making their life unbalanced. Understanding and applying boundaries will be explored and addressed for obtaining  and maintaining a balanced life. Patients will be encouraged to explore ways to assertively make their unbalanced needs known to significant others in their lives, using other group members and facilitator for support and feedback.  Therapeutic Goals: Patient will identify two or more emotions or situations they have that consume much of in their lives. Patient will identify signs/triggers that life has become out of balance:  Patient will identify two ways to set boundaries in order to achieve balance in their lives:  Patient will demonstrate ability to communicate their needs through discussion and/or role plays  Summary of Patient Progress:  The patient did not attend group.      Roselyn GORMAN Lento, LCSW

## 2024-03-03 NOTE — Group Note (Signed)
 Date:  03/03/2024 Time:  10:02 AM  Group Topic/Focus:  Activity Group: The focus of the group is to promote activity for the patients and encourage them to go outside to the courtyard and get some fresh air and some exercise.    Participation Level:  Did Not Attend   Leon Ross [place] 03/03/2024, 10:02 AM

## 2024-03-04 LAB — GLUCOSE, CAPILLARY: Glucose-Capillary: 271 mg/dL — ABNORMAL HIGH (ref 70–99)

## 2024-03-04 NOTE — Progress Notes (Signed)
  [facility] Adult Case Management Discharge Plan :  Will you be returning to the same living situation after discharge:  Yes,  Patient to return to the [employer] with [employer].  At discharge, do you have transportation home?: Yes,  CSW has arranged taxi services on patient's behalf.  Do you have the ability to pay for your medications: Yes,  TRILLIUM TAILORED PLAN / TRILLIUM TAILORED PLAN  Release of information consent forms completed and in the chart;  Patient's signature needed at discharge.  Patient to Follow up at:  Follow-up Information     [place]  [facility] Follow up.   Why: Primary care appointment 03/20/24 at 1:40 PM with Leon Yacopino, DNP. Contact information: Phone: [phone] Fax: [phone]  [address], [place], [place] [address]        [facility]. Go to.   Why: Your CST team will follow up with you 03/05/24 immediately following your discharge.  In person therapy appointment with your therapist, Leon Molt, LCSW, LCAS at 11:30 AM. Contact information: [phone] Fax: [phone]  [address] [place], [place], [place] [address]        [facility] at [facility] Follow up.   Why: In person psychiatry appointment 03/28/24 at 9 AM w/ Dr. Izella, MD.   Please arrive by 8:30 AM. Please bring identification and your insurance cards. Contact information: [address] [place], [place], [place] [address]   Phone: [phone]  Fax: [phone]                Next level of care provider has access to [facility] Link:no  Safety Planning and Suicide Prevention discussed: Yes,  Patient Refusal for Family/Significant Other Suicide Prevention Education: The patient Leon Ross has refused to provide written consent for family/significant other to be provided Family/Significant Other Suicide Prevention Education during admission and/or prior  to discharge.  Physician notified. SPE completed with pt, as pt refused to consent to family contact. SPI pamphlet provided to pt and pt was encouraged to share information with support network, ask questions, and talk about any concerns relating to SPE. Pt denies access to guns/firearms and verbalized understanding of information provided. Mobile Crisis information also provided to pt.       Has patient been referred to the Quitline?: Patient refused referral for treatment  Patient has been referred for addiction treatment: No known substance use disorder.  Leon CHRISTELLA Kerns, LCSW 03/04/2024, 9:12 AM

## 2024-03-04 NOTE — Progress Notes (Signed)
 Patient's 0800 CBG was 271. This was taken after patient already consumed breakfast. Patient states that he forgot to come up for his reading prior to going down to eat. Provider will be notified.

## 2024-03-04 NOTE — Progress Notes (Signed)
 Patient ID: Leon Ross, male   DOB: March 26, 1971, 53 y.o.   MRN: [id number] Patient expressed that he is ready to be discharged but he is very anxious about it. Patient complete his suicide safety plan and verbalized that he plans to utilize it once discharged. A copy was provided for his keeping. AVS discussed with patient and he verbalized understanding. A copy of the suicide risk assessment was provided to the patient. The patient's belonging including his cell phone was returned to him. Patient was picked up by [employer] taxi for transportation

## 2024-03-04 NOTE — BHH Counselor (Signed)
 CSW touched base with the [employer] with [employer] at [phone] to confirm patient's return for his pending discharge.   [facility] staff reported that they are prepared to receive the patient and confirmed discharge address for CSW.   CSW to continue to assess to engage in safe discharge planning.   Criss Bartles, MSW, LCSWA 03/04/2024 9:24 AM

## 2024-03-04 NOTE — Progress Notes (Signed)
 [facility] Discharge Suicide Risk Assessment   Principal Problem: MDD (major depressive disorder) Discharge Diagnoses: Principal Problem:   MDD (major depressive disorder)   Total Time spent with patient: 1 hour  Musculoskeletal: Strength & Muscle Tone: within normal limits Gait & Station: normal   Psychiatric Specialty Exam Appearance: Clean, appropriate grooming, good eye contact Behavior: Cooperative, appropriate interaction Speech: Normal rate and tone Mood: Improving Affect: Appropriate Thought Process: Linear, goal-directed Thought Content: No delusions or paranoia noted Hallucinations: denies  SI/HI: Denied Orientation: Oriented to person, place, and time Attention: Intact Memory: Grossly intact Concentration: Adequate Fund of Knowledge: Appropriate for education level Language: Fluent, no aphasia Sleep: Not reported today Appetite: Not reported today Insight: Fair Judgment: Fair Suicidal Thoughts: denies  Homicidal Thoughts: denies   Assets  Assets: Manufacturing systems engineer; Desire for Improvement; Resilience   Sleep  Sleep:No data recorded Estimated Sleeping Duration (Last 24 Hours): 8.00-8.50 hours  Physical Exam: Physical Exam ROS Blood pressure 95/68, pulse 72, temperature (!) 97.2 F (36.2 C), resp. rate 18, height 5' 6 (1.676 m), weight 101.6 kg, SpO2 99%. Body mass index is 36.15 kg/m.  Mental Status Per Nursing Assessment::   On Admission:  Suicidal ideation indicated by patient  Demographic Factors:  Male, Caucasian, and Low socioeconomic status  Loss Factors: Financial problems/change in socioeconomic status  Historical Factors: NA  Risk Reduction Factors:   Sense of responsibility to family, Positive social support, Positive therapeutic relationship, and Positive coping skills or problem solving skills  Continued Clinical Symptoms:  Previous Psychiatric Diagnoses and Treatments  Cognitive Features That Contribute To Risk:  None     Suicide Risk:  Minimal: No identifiable suicidal ideation.  Patients presenting with no risk factors but with morbid ruminations; may be classified as minimal risk based on the severity of the depressive symptoms   Follow-up Information     [facility] Follow up.   Why: Primary care appointment 03/20/24 at 1:40 PM with Jessica Yacopino, DNP. Contact information: Phone: [phone] Fax: [phone]  [address], [place], [place] [address]        [facility]. Go to.   Why: Your CST team will follow up with you 03/05/24 immediately following your discharge.  In person therapy appointment with your therapist, Bascom Molt, LCSW, LCAS at 11:30 AM. Contact information: [phone] Fax: [phone]  [address] [place], [place], [place] [address]        [facility] at [facility] Follow up.   Why: In person psychiatry appointment 03/28/24 at 9 AM w/ Dr. Izella, MD.   Please arrive by 8:30 AM. Please bring identification and your insurance cards. Contact information: [address] [place], [place], [place] [address]   Phone: [phone]  Fax: [phone]                Safety plan reviewed. Patient denies any current suicidal or homicidal ideation. No active hallucinations or delusions noted at time of discharge. Insight and judgment are improved, and patient agrees to continue outpatient follow-up and medication adherence.  Hoy CHRISTELLA Pinal, NP 03/04/2024, 8:49 AM

## 2024-03-04 NOTE — Plan of Care (Signed)
  Problem: Education: Goal: Knowledge of Leon Ross General Education information/materials will improve Outcome: Progressing   Problem: Education: Goal: Emotional status will improve Outcome: Progressing   Problem: Education: Goal: Mental status will improve Outcome: Progressing   

## 2024-03-04 NOTE — Group Note (Signed)
 Date:  03/04/2024 Time:  10:56 AM  Group Topic/Focus:  Goals Group:   The focus of this group is to help patients establish daily goals to achieve during treatment and discuss how the patient can incorporate goal setting into their daily lives to aide in recovery.    Participation Level:  Did Not Attend    Leon Ross 03/04/2024, 10:56 AM
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
## 2024-03-20 ENCOUNTER — Encounter: Payer: Self-pay | Admitting: *Deleted

## 2024-03-20 ENCOUNTER — Ambulatory Visit (INDEPENDENT_AMBULATORY_CARE_PROVIDER_SITE_OTHER): Payer: MEDICAID | Admitting: Student

## 2024-03-20 ENCOUNTER — Encounter: Payer: Self-pay | Admitting: Student

## 2024-03-20 DIAGNOSIS — Z91199 Patient's noncompliance with other medical treatment and regimen due to unspecified reason: Secondary | ICD-10-CM
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
## 2024-03-20 NOTE — Progress Notes (Signed)
 Error.  Pt no show

## 2024-03-28 ENCOUNTER — Encounter (HOSPITAL_COMMUNITY): Payer: Self-pay

## 2024-03-28 ENCOUNTER — Encounter (HOSPITAL_COMMUNITY): Payer: MEDICAID | Admitting: Psychiatry

## 2024-03-28 NOTE — Care Plan (Signed)
  Care Management Final Transition Planning Assessment      Patient's Post Acute Contact Information: [phone] (Mobile)  Additional transition plan information: DC to the community  Has a PCP appointment been made?: No   Future Appointments  Date Time Provider Department Center  04/04/2024  2:30 PM [facility] CARDIO ED CHEST PAIN TRANSITION  [facility]    Has a specialist appointment been made?: Yes   Post Acute Facility needed at discharge?: No        Home Care/ Home Medical Equipment needed at discharge?: No           Outpatient/Community Referrals needed for discharge?: Yes  Outpatient/Community Resources: Other  Agency detail (Name/Phone #): [facility]- for homeless resources Transportation Anticipated: none    Currently receiving outpatient dialysis?: No       Discharge Disposition: Home w/ Self Care        Quality data for continuing care services shared with patient and/or representative?: N/A Patient and/or family were provided with choice of facilities / services that are available and appropriate to meet post hospital care needs?: Yes  List choices in order highest to lowest preferred, if applicable. : CM offered Emergency Shelter and transportation in [place], Pt declines     Final Assessment Complete: Yes     Readmission Risk Score:  Predictive Model Details  No score data available for [facility] Risk of Unplanned Readmission
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
## 2024-03-28 NOTE — Nursing Note (Signed)
   Care Management Reassessment   Estimated Discharge Date: 03/28/2024  Current Discharge Plan: Home with Self Care  Coordination of Care and Care Progression Notes: CM assessment completed (see CM consult note). CM discuss Medicaid transport, pt has a working cell phone. Pt is ambulates with out issue. CM provided hard copies of Medicaid transport information including the direct number to call to schedule transportation to his appointments. Pt is homeless so he will call to set-up as his location may change. CM provided the information for follow clinic apt at [facility] on 04/04/24 at 2:30pm including address and phone number. CM encouraged pt to keep apt and to schedule transport at least 48-72hours before apt time to prevent delay. CM also encouraged t if he could not make the pat to call and re-schedule. PT verbalized understanding and in agreement. CM also provided homeless resources in [place] and information for [facility]. Pt declines Emergency Shelter in [place] and states he has been banned from [facility] services in [place]. Pt declines the need for taxi voucher and will DC to the bus. E provider and RN update on the above.  Leon [name], RN March 28, 2024 10:23 AM

## 2024-03-29 NOTE — Progress Notes (Signed)
 Patient did not show up for the appointment. Attempted to call the patient but received no response.

## 2024-04-03 NOTE — ED Provider Notes (Signed)
 [facility] Emergency Department Provider Note   ED Clinical Impression   Final diagnoses:  None    Initial Impression, ED Course, Assessment and Plan   Impression: Leon Ross is a 53 y.o. male with past medical history of hypertension, diabetes, hyperlipidemia, GERD, anxiety, and homelessness who presents emergency department for evaluation of an episode of chest pain, heart racing, shortness of breath, and hyperglycemia.   ED Triage Vitals  Enc Vitals Group     BP 03/31/24 1854 124/83     Pulse 03/31/24 1854 103     SpO2 Pulse --      Resp 03/31/24 1854 24     Temp 03/31/24 1854 36.8 C (98.2 F)     Temp Source 04/01/24 0050 Oral     SpO2 03/31/24 1854 99 %     Weight 03/31/24 1854 (!) 102.1 kg (225 lb)   Patient alert and oriented, nontoxic in appearance. A bit sweaty. Regular cardiac rate and rhythm lungs clear to auscultation.  Abdomen soft, nondistended, nontender.  Lower extremities without swelling, discoloration, tenderness.  Differential diagnosis includes ACS, cardiac arrhythmia, panic attack, DKA/HHS history and physical exam not strongly consistent with but will consider pneumothorax, pneumonia, pulmonary edema, pleural effusion, pulmonary embolus.  ED Course as of 04/03/24 1511  Sat Mar 31, 2024  2114 ECG, my interpretation: Sinus tachycardia, rate 102 bpm, normal axis, normal intervals, no ST segment elevation or depression.  No concerning T wave inversions.  When compared with ECG from 03/27/2024 no significant change appreciated.  Repeat ECG showing sinus tachycardia, rate 92 bpm, normal axis, normal intervals, no ST deviation.  No change from ECG earlier today.  2118 CBC shows no leukocytosis, no anemia, normal platelets.  2118 CMP shows mild hyponatremia 131,other electrolytes normal save for potassium which is hemolyzed and not resulted.  Normal creatinine.  Hyperglycemia of 492.  Normal LFTs, except AST hemolyzed.  2120 High-sensitivity troponin 3.   Repeat also 3.  After first liter of fluids blood glucose 394.  Patient received second liter IV fluids given concerns for dehydration.  Patient's workup reassuring and patient reevaluated, sleeping comfortably in bed.  Arouses easily, symptoms resolved, still breathing comfortably and speaking in full sentences without difficulty.  Discussed with patient at length need to take medications, especially restarting his metformin  1000 mg twice daily.  Emphasized the need to reestablish with primary care.  Discussed with patient possibility of missed cardiac arrhythmia, potential benefit from wearing Zio patch in the future.  Patient provided information about charity care and encouraged to apply for financial assistance.  At this hour emergency department does not have case management, and Zio patch unable to be placed.  Feel patient safe for discharge at this point in time but discussed return precautions.  Patient expresses understanding agreement.  Patient discharged in good condition.    Additional Medical Decision Making   I independently visualized the EKG tracing. See above I independently visualized the radiology images. See above Patient care affected by homelessness and financial instability.   Labs and radiology results that were available during my care of the patient were independently reviewed by me and considered in my medical decision making.  Portions of this record have been created using Scientist, clinical (histocompatibility and immunogenetics). Dictation errors have been sought, but may not have been identified and corrected. ____________________________________________  I have reviewed the triage vital signs and the nursing notes.   History   Chief Complaint Chest Pain   HPI  Leon Ross is a  53 y.o. male with past medical history of hypertension, diabetes, hyperlipidemia, GERD, anxiety, and homelessness who presents emergency department for evaluation of an episode of chest pain, heart racing,  shortness of breath, and hyperglycemia.  Patient states he was leaving [employer] when he began feeling the above-stated symptoms.  Chest pain was right-sided radiating to right arm.  He felt his heart was beating very quickly.  He felt a bit short of breath.  States it felt somewhat similar to previous panic attacks but not the same.  At this point in time shortness of breath and heart racing have resolved, still has mild lingering right-sided chest pain.  He denies fever, chills, cough, runny nose, sore throat.  Denies abdominal pain.  He states he had nausea earlier but that it has resolved, he has had no vomiting.  No diarrhea.  Lower extremities without swelling or discoloration.  Patient states he is homeless and unable to afford any medications, has not taken any medicines for a couple months.  Denies polyuria/polydipsia but states he has been drinking a lot of water.  He also states that his partner was recently admitted to [facility]; since then he has felt very anxious and alone, feels this is contributing to his symptoms.   Past Medical History[1]  Problem List[2]  Past Surgical History[3]  No current facility-administered medications for this encounter.  Current Outpatient Medications:  .  blood sugar diagnostic (GLUCOSE BLOOD) Strp, Use to check blood sugar as directed with insulin  3 times a day & for symptoms of high or low blood sugar., Disp: 100 strip, Rfl: 0 .  dapagliflozin propanediol (FARXIGA) 10 mg Tab tablet, Take 1 tablet (10 mg total) by mouth every morning., Disp: 30 tablet, Rfl: 2 .  famotidine  (PEPCID ) 20 MG tablet, Take 1 tablet (20 mg total) by mouth two (2) times a day., Disp: , Rfl:  .  ferrous sulfate 325 (65 FE) MG tablet, Take 1 tablet (325 mg total) by mouth two (2) times a day., Disp: , Rfl:  .  insulin  glargine (BASAGLAR , LANTUS ) 100 unit/mL (3 mL) injection pen, Inject 0.2 mL (20 Units total) under the skin two (2) times a day., Disp: 15 mL,  Rfl: 2 .  insulin  lispro (HUMALOG) 100 unit/mL injection pen, Inject 20 Units under the skin Three (3) times a day with a meal., Disp: 30 mL, Rfl: 3 .  lamoTRIgine  (LAMICTAL ) 200 MG tablet, Take 1 tablet (200 mg total) by mouth daily., Disp: , Rfl:  .  lancets (ACCU-CHEK SOFTCLIX LANCETS) Misc, Use to check blood sugar as directed with insulin  3 times a day & for symptoms of high or low blood sugar., Disp: 100 each, Rfl: 0 .  lurasidone (LATUDA) 40 mg Tab, Take 1 tablet (40 mg total) by mouth daily with evening meal., Disp: , Rfl:  .  metFORMIN  (GLUCOPHAGE -XR) 500 MG 24 hr tablet, Take 2 tablets (1,000 mg total) by mouth two (2) times a day., Disp: 360 tablet, Rfl: 0 .  mirtazapine  (REMERON ) 15 MG tablet, Take 1 tablet (15 mg total) by mouth nightly., Disp: , Rfl:  .  pen needle, diabetic 32 gauge x 5/32 (4 mm) Ndle, Use with insulin  up to 4 times/day as needed., Disp: 100 each, Rfl: 0 .  prazosin  (MINIPRESS ) 2 MG capsule, Take 1 capsule (2 mg total) by mouth nightly. Take with one 5mg  capsule for a 7mg  total evening dose., Disp: 30 capsule, Rfl: 0 .  prazosin  (MINIPRESS ) 5 MG  capsule, Take 1 capsule (5 mg total) by mouth nightly. Take with one 2mg  capsule for a 7mg  total evening dose., Disp: 30 capsule, Rfl: 0 .  rosuvastatin  (CRESTOR ) 40 MG tablet, Take 1 tablet (40 mg total) by mouth daily., Disp: , Rfl:  .  sertraline  (ZOLOFT ) 100 MG tablet, Take 1.5 tablets (150 mg total) by mouth daily., Disp: 45 tablet, Rfl: 0 .  traZODone  (DESYREL ) 150 MG tablet, Take 1 tablet (150 mg total) by mouth nightly., Disp: , Rfl:   Allergies Lithium analogues, Lithium, Benztropine, Divalproex, and Divalproex sodium  Family History[4]  Social History Short Social History[5]  Physical Exam   Constitutional: Alert and oriented. nontoxic appearing and in no distress.  Eyes: Conjunctivae are normal. ENT      Head: Normocephalic and atraumatic.      Nose: No congestion.      Mouth/Throat: Mucous membranes  are moist.      Neck: No stridor. Cardiovascular: Normal rate, regular rhythm.  Respiratory: Normal respiratory effort. Breath sounds are normal. Gastrointestinal: Soft and nontender. There is no CVA tenderness. Musculoskeletal: Normal range of motion in all extremities. Lower extremities are without tenderness, discoloration, or edema.  Neurologic: Normal speech and language. No gross focal neurologic deficits are appreciate; normal strength and sensation Skin: Skin is warm, dry and intact. No rash noted. Psychiatric: Mood and affect are normal. Speech and behavior are normal.  Procedures   Procedure(s) performed: None.          [1] Past Medical History: Diagnosis Date  . Diabetes        type 2  . GERD (gastroesophageal reflux disease)   . HLD (hyperlipidemia)   . HTN (hypertension)   . Obesity   [2] Patient Active Problem List Diagnosis  . Diabetes      . Chest pain  . Depression  . Hypotension  . HLD (hyperlipidemia)  . PTSD (post-traumatic stress disorder)  . Schizoaffective disorder      [3] No past surgical history on file. [4] History reviewed. No pertinent family history. [5] Social History Tobacco Use  . Smoking status: Former    Current packs/day: 0.00    Types: Cigarettes    Quit date: 05/20/2020    Years since quitting: 3.8  . Smokeless tobacco: Never  Vaping Use  . Vaping status: Never Used  Substance Use Topics  . Alcohol use: Never  . Drug use: Never   Hoppens, Oneil LABOR, MD 04/03/24 [phone]
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
## 2024-04-17 NOTE — Group Note (Signed)
 GROUP NOTE  Group Date: 04/17/2024 Start Time: 1300 End Time: 1400 Facilitators: Rumalda Nettle, PsyD   TYPE OF THERAPY: Evidence-Based Group Therapy based on Principles of Dialectical Behavior Therapy (DBT)   PURPOSE:  Provide Introduction to importance of emotion regulation and strategies for practicing this skill   INTERVENTION: Group Therapy    PATIENT RESPONSE:  Medford was approached in the Day Area and invited to group.  He did not attend.   PLAN: Writer will facilitate next Psychology Group on Thursday, April 19, 2024.

## 2024-04-17 NOTE — Discharge Summary (Signed)
 [facility] Psychiatry  Discharge Summary  Admit date and time: 04/11/2024  9:00 PM Discharge date and time: 04/17/2024  12:00 PM Discharge to: Shelter Discharge Service: Psychiatry (PSY) Discharge Attending Physician: Leon Fredda Cook, MD Discharge Unit 24-7 Contact Number: [phone]  Allergies: Lithium analogues, Lithium, Benztropine, Divalproex, and Divalproex sodium  Chief Concern/Admitting Diagnosis:  SUICIDAL   Discharge Diagnosis:  Active Problems:   Depression (POA: Yes) Resolved Problems:   * No resolved hospital problems. *   Stressors: Homelessness, family estrangement, and bereavement, combined with his current suicidal ideation and recent self-injurious behavior   Disability Assessment Scale: none   Discharge Day Services:  Hours of sleep overnight :    The treatment team, including resident and attending physicians, nursing staff, occupational therapy, recreational therapy, and social work/case management , met to discuss the patient's progress and plan of care.  Per 04/16/24 RN Epic note: [name] is A&OX4, pleasant, calm and appreciative of care.  On assessment he denied SI/HI/AVH and any intent to self-injure.  He remains compliant with all scheduled medication.  No unsafe behavior exhibited. [place] was present through the evening in the day room watching TV with peers before reading and going to bed. Care plan reviewed 1:1 and continued; Pt encouraged, reassured and monitored as ordered for safety. Pt. Alert and able to make needs known, no s/s of distress noted, pain denied, Pt. Denies thoughts of harming self or others, calm and cooperative mood noted, Pt. Completing 100% of all meals/snacks, received insulin  according to completion of meals and BG, all meds/insulin  tolerated well, Pt. Participated in groups/activities, interacted with peers and staff, podiatry consulted with Pt., follow up will be provided, Zoloft  updated, Pt. Allowed to use  clippers with staff present, Pt. Present in dayroom most of the day.   MD interview: Confirmed he is unable to get information about his wife, which is a continued source of stress for him. Sessions helpful for thinking about boundaries that are best for him since he cannot control others' actions. Ongoing nightmares about learning she's dead. Mood conflicted bc part of him wants to take whatever steps are necessary for recovery, but he also still wants to take care of her; part also says just screw it all. No active SI, thoughts of self harm. Continues to hear same voice almost constantly telling him he's no good and shouldn't be hear, this is stable. If he left today he would go gather his belongings and find somewhere else to stay; this is a painful prospect.   ROS:  Constitutional: Denies fever/chills HEENT: Denies visual disturbances and hearing change Heme/Lymph: Denies bleeding problems, blood clots, bruising, fatigue, jaundice, night sweats, pallor, swollen lymph nodes, and weight loss Endocrine: Denies hot flashes malaise/lethargy skin changes temperature intolerance unexpected weight changes Cardiac: Denies chest pain and palpitations Lungs: Denies shortness of breath  and dyspnea GI: Denies nausea, vomiting , diarrhea , and constipation GU: Denies dysuria Musculoskeletal: Denies muscle aches and weakness Neuro: Denies involuntary movements, tremor, weakness/numbness, headaches, and dizziness    Vitals: Patient Vitals for the past 12 hrs:  BP Temp Temp src Pulse Resp SpO2  04/17/24 0803 121/92 37.1 C (98.8 F) Temporal 107 20 98 %    Height:  172.7 cm (5' 8) Weight: (!) 100.9 kg (222 lb 7.1 oz) BMI: Body mass index is 33.82 kg/m.   Mental Status Exam: Appearance:    Appears stated age, Well developed, and Clean/Neat  Motor:   No abnormal movements  Speech/Language:    Normal rate, volume, tone, fluency  Mood:   Depressed  Affect:   Blunted, Calm, Cooperative,  Constricted, and Depressed  Thought process:   Logical, linear, clear, coherent, goal directed  Thought content:     Denies SI, HI, self harm, delusions, obsessions, paranoid ideation, or ideas of reference  Perceptual disturbances:     Denies auditory and visual hallucinations, behavior not concerning for response to internal stimuli   Orientation:   Oriented to person, place, time, and general circumstances  Attention:   Able to fully attend without fluctuations in consciousness  Concentration:   Able to fully concentrate and attend  Memory:   Immediate, short-term, long-term, and recall grossly intact   Fund of knowledge:    Consistent with level of education and development  Insight:     Fair  Judgment:    Fair  Impulse Control:   Fair    Test Results: Data Review: Lab results last 24 hours:   Recent Results (from the past 24 hours)  POCT Glucose   Collection Time: 04/16/24  4:41 PM  Result Value Ref Range   Glucose, POC 239 (H) 70 - 179 mg/dL  POCT Glucose   Collection Time: 04/16/24  8:37 PM  Result Value Ref Range   Glucose, POC 224 (H) 70 - 179 mg/dL  POCT Glucose   Collection Time: 04/17/24  7:57 AM  Result Value Ref Range   Glucose, POC 246 (H) 70 - 179 mg/dL  POCT Glucose   Collection Time: 04/17/24 12:13 PM  Result Value Ref Range   Glucose, POC 255 (H) 70 - 179 mg/dL   CBC - Results in Past 30 Days Result Component Current Result Ref Range Previous Result Ref Range  HCT 44.4 (04/11/2024) 39.0 - 48.0 % 42.0 (03/31/2024) 39.0 - 48.0 %  HGB 15.5 (04/11/2024) 12.9 - 16.5 g/dL 84.8 (1/76/7974) 87.0 - 16.5 g/dL  MCH 72.2 (0/01/7973) 74.0 - 32.4 pg 28.2 (03/31/2024) 25.9 - 32.4 pg  MCHC 34.9 (04/11/2024) 32.0 - 36.0 g/dL 63.9 (1/76/7974) 67.9 - 36.0 g/dL  MCV 20.5 (0/01/7973) 22.3 - 95.7 fL 78.4 (03/31/2024) 77.6 - 95.7 fL  MPV 8.1 (04/11/2024) 6.8 - 10.7 fL 9.1 (03/31/2024) 6.8 - 10.7 fL  Platelet 239 (04/11/2024) 150 - 450 10*9/L 245 (03/31/2024) 150 - 450 10*9/L  RBC 5.59  (04/11/2024) 4.26 - 5.60 10*12/L 5.36 (03/31/2024) 4.26 - 5.60 10*12/L  WBC 9.2 (04/11/2024) 3.6 - 11.2 10*9/L 9.7 (03/31/2024) 3.6 - 11.2 10*9/L   CBC with diff - Results in Past 30 Days Result Component Current Result Ref Range Previous Result Ref Range  Absolute Basophils 0.1 (04/11/2024) 0.0 - 0.1 10*9/L 0.2 (H) (03/31/2024) 0.0 - 0.1 10*9/L  Absolute Eosinophils 0.1 (04/11/2024) 0.0 - 0.5 10*9/L 0.2 (03/31/2024) 0.0 - 0.5 10*9/L  Absolute Lymphocytes 1.7 (04/11/2024) 1.1 - 3.6 10*9/L 2.1 (03/31/2024) 1.1 - 3.6 10*9/L  Absolute Monocytes 0.6 (04/11/2024) 0.3 - 0.8 10*9/L 0.6 (03/31/2024) 0.3 - 0.8 10*9/L  Absolute Neutrophils 6.7 (04/11/2024) 1.8 - 7.8 10*9/L 6.6 (03/31/2024) 1.8 - 7.8 10*9/L  Basophils % 0.7 (04/11/2024) % 1.6 (03/31/2024) %  Eosinophils % 1.4 (04/11/2024) % 2.1 (03/31/2024) %  HCT 44.4 (04/11/2024) 39.0 - 48.0 % 42.0 (03/31/2024) 39.0 - 48.0 %  HGB 15.5 (04/11/2024) 12.9 - 16.5 g/dL 84.8 (1/76/7974) 87.0 - 16.5 g/dL  Lymphocytes % 81.2 (0/01/7973) % 21.5 (03/31/2024) %  Monocytes % 7.0 (04/11/2024) % 6.5 (03/31/2024) %  Platelet 239 (04/11/2024) 150 - 450 10*9/L 245 (03/31/2024) 150 - 450  10*9/L  RBC 5.59 (04/11/2024) 4.26 - 5.60 10*12/L 5.36 (03/31/2024) 4.26 - 5.60 10*12/L  WBC 9.2 (04/11/2024) 3.6 - 11.2 10*9/L 9.7 (03/31/2024) 3.6 - 11.2 10*9/L   BMP - Results in Past 30 Days Result Component Current Result Ref Range Previous Result Ref Range  BUN 7 (L) (04/11/2024) 9 - 23 mg/dL 8 (L) (1/76/7974) 9 - 23 mg/dL  Chloride 99 (0/01/7973) 98 - 107 mmol/L 98 (03/31/2024) 98 - 107 mmol/L  CO2 24.0 (04/11/2024) 20.0 - 31.0 mmol/L 20.0 (03/31/2024) 20.0 - 31.0 mmol/L  Creatinine 0.77 (04/11/2024) 0.73 - 1.18 mg/dL 9.32 (L) (1/76/7974) 9.26 - 1.18 mg/dL  Glucose 572 (H) (0/01/7973) 70 - 179 mg/dL 507 (H) (1/76/7974) 70 - 179 mg/dL  Potassium 3.9 (0/01/7973) 3.4 - 4.8 mmol/L 3.8 (03/31/2024) 3.4 - 4.8 mmol/L  Sodium 136 (04/11/2024) 135 - 145 mmol/L 131 (L) (03/31/2024) 135 - 145 mmol/L   CMP-  Results in Past 30 Days Result  Component Current Result Ref Range Previous Result Ref Range  Albumin 4.1 (04/11/2024) 3.4 - 5.0 g/dL 4.2 (1/76/7974) 3.4 - 5.0 g/dL  Alkaline Phosphatase 99 (04/11/2024) 46 - 116 U/L 97 (03/31/2024) 46 - 116 U/L  ALT 16 (04/11/2024) 10 - 49 U/L 13 (03/31/2024) 10 - 49 U/L  AST 12 (04/11/2024) <=34 U/L <8 (03/31/2024) <=34 U/L  BUN 7 (L) (04/11/2024) 9 - 23 mg/dL 8 (L) (1/76/7974) 9 - 23 mg/dL  Calcium  10.1 (04/11/2024) 8.7 - 10.4 mg/dL 89.8 (1/76/7974) 8.7 - 10.4 mg/dL  Chloride 99 (0/01/7973) 98 - 107 mmol/L 98 (03/31/2024) 98 - 107 mmol/L  CO2 24.0 (04/11/2024) 20.0 - 31.0 mmol/L 20.0 (03/31/2024) 20.0 - 31.0 mmol/L  Creatinine 0.77 (04/11/2024) 0.73 - 1.18 mg/dL 9.32 (L) (1/76/7974) 9.26 - 1.18 mg/dL  Glucose 572 (H) (0/01/7973) 70 - 179 mg/dL 507 (H) (1/76/7974) 70 - 179 mg/dL  Potassium 3.9 (0/01/7973) 3.4 - 4.8 mmol/L 3.8 (03/31/2024) 3.4 - 4.8 mmol/L  Sodium 136 (04/11/2024) 135 - 145 mmol/L 131 (L) (03/31/2024) 135 - 145 mmol/L  Total Bilirubin 1.5 (H) (04/11/2024) 0.3 - 1.2 mg/dL 1.0 (1/76/7974) 0.3 - 1.2 mg/dL  Total Protein 6.9 (0/01/7973) 5.7 - 8.2 g/dL 7.6 (1/76/7974) 5.7 - 8.2 g/dL   Toxicology screen- Results in Past 30 Days Result Component Current Result Ref Range Previous Result Ref Range  Amphetamines Screen, Ur Negative (04/11/2024) <500 ng/mL Not in Time Range   Barbiturates Screen, Ur Negative (04/11/2024) <200 ng/mL Not in Time Range   Benzodiazepines Screen, Urine Negative (04/11/2024) <200 ng/mL Not in Time Range   Cannabinoids Screen, Ur Negative (04/11/2024) <20 ng/mL Not in Time Range   Cocaine(Metab.)Screen, Urine Negative (04/11/2024) <150 ng/mL Not in Time Range   Methadone Screen, Urine Negative (04/11/2024) <300 ng/mL Not in Time Range   Opiates Screen, Ur Negative (04/11/2024) <300 ng/mL Not in Time Range   [   Urinalysis-  Results in Past 30 Days Result Component Current Result Ref Range Previous Result Ref Range  Bilirubin, UA Negative (04/11/2024) Negative Negative (03/31/2024) Negative  Blood, UA  Negative (04/11/2024) Negative Negative (03/31/2024) Negative  Glucose, UA >1000 mg/dL (A) (0/01/7973) Negative >1000 mg/dL (A) (1/76/7974) Negative  Ketones, UA 10 mg/dL (A) (0/01/7973) Negative Negative (03/31/2024) Negative  Leukocyte Esterase, UA Negative (04/11/2024) Negative Negative (03/31/2024) Negative  Nitrite, UA Negative (04/11/2024) Negative Negative (03/31/2024) Negative  pH, UA 5.5 (04/11/2024) 5.0 - 9.0 7.0 (03/31/2024) 5.0 - 9.0  Specific Gravity, UA 1.023 (04/11/2024) 1.003 - 1.030 1.029 (03/31/2024) 1.003 - 1.030  Urobilinogen, UA <2.0 mg/dL (0/01/7973) <7.9  mg/dL <7.9 mg/dL (1/76/7974) <7.9 mg/dL   Imaging: None Pending Test Results: No pending test or studies  Psychometrics: Not applicable  Hospital Course:    Leon Ross is a 53 y.o. male with a past medical and psychiatric history of MDD, Bipolar disorder type 1, Schizoaffective disorder, PTSD, Diabetes Mellitus type 2, GERD, who initially presented to ED via self/family/police in the context of social stressor admitted involuntarily to the [facility] Crisis Stabilization unit for safety, stabilization, and management of suicidal ideation.  Additional relevant history includes: Patient was admitted due to suicidal ideation.     Hospitalization Narrative:    Patient is a 53 year old male with a history of bipolar disorder and recent homelessness who presented following suicidal behavior in the context of interpersonal conflict. The day of admission, he had an argument with his wife/partner during which he reports cutting his left wrist with a hunting knife, resulting in a superficial wound. He described feeling hopeless, helpless, and suicidal at that time. Approximately one hour later, police arrived, and he was arrested for domestic violence. He reports that this further intensified his depression and suicidal ideation, and he subsequently called Crisis to report his suicidal thoughts. The patient demonstrates marked emotional distress,  hopelessness, and heightened risk behaviors related to his psychiatric condition throughout this hospitalization.The patient was offered tools he could potentially use as coping mechanisms during distressful times. Through hospitalization, there was improvement in suicidal ideation. During hospitalization, patient remained on q15-minute observation, engaged with psychiatry and therapy teams, and participated in milieu, RT, and OT interventions. He discussed coping strategies, safety planning, and triggers related to his wife's erratic behavior, substance use, and history of psychotic episodes. He tolerated continuation of home medical regimen (insulin , metformin , famotidine ) and no acute medical issues arose. Psychotropic regimen was reviewed; patient's current mood, SI, and psychosocial stressors were monitored daily. His presentation remained most consistent with Major Depressive Disorder, exacerbated by psychosocial stressors and grief related to familial and partner conflicts. Patient engaged in daily therapy and supportive interventions, demonstrating insight into triggers and willingness to utilize coping strategies.     Psychiatric Follow Up Items:  # Major Depressive Disorder # PMH: Bipolar Disorder type I, PTSD, Schizoaffective disorder   -- Start Zoloft  50 mg on 09/08 -- Continue Risperdal 2 mg at night   Psychiatric aftercare plan: follow up with psychiatry outpatient.   Medical: Admission workup included CBC, CMP, BMP, and UA, was unremarkable  Medical conditions managed during this admission:   #Type 2 Diabetes Mellitus Blood Glucose on 09/07: 224 (7 am), 154 (11 am), 115 (4 pm), 195 (8 pm).  --Endocrinology consulted, recs: Continue Insulin  Lantus  26 U, Continue Insulin  Lispro 12 U after meals. Continue metformin  to 1000 mg BID. Lispro ISF 40 > 140.  --Keep monitoring Point of care glucose testing achs --Consider podiatry evaluation for foot sores   #GERD --Continue Famotidine  20  mg BID  #Hyperlipidemia per PMHx: No current medication #Hypertension per PMHx: No current medication. BP wnr. Monitor BP #Asthma per PMHx: No current medication. Monitor for asthma exacerbation. #Nutritional Status: Obesity type 1    Medical Follow up items:  #Type 2 Diabetes Mellitus --Endocrinology consulted, recs: Continue Insulin  Lantus  26 U, Continue Insulin  Lispro 12 U after meals. Continue metformin  to 1000 mg BID. Lispro ISF 40 > 140.  --Keep monitoring Point of care glucose testing achs --Consider podiatry evaluation for foot sores    #GERD --Continue Famotidine  20 mg BID   #Hyperlipidemia per PMHx: No current medication #Hypertension  per PMHx: No current medication. BP wnr. Monitor BP #Asthma per PMHx: No current medication. Monitor for asthma exacerbation. #Nutritional Status: Obesity type 1   Metabolic Monitoring: Initial Weight: Weight: (!) 100.9 kg (222 lb 7.1 oz) Last Weight: Weight: (!) 100.9 kg (222 lb 7.1 oz) Last BMI: Body mass index is 33.82 kg/m. Admit BP: BP: 126/82 Last BP: BP: 121/92 Lipid Panel:  Lab Results  Component Value Date   Cholesterol, Total 118 05/21/2022   Cholesterol, LDL, Calculated 20 (L) 05/21/2022   Cholesterol, HDL 25 (L) 05/21/2022   Triglycerides 364 (H) 05/21/2022   Hemoglobin A1C:  Lab Results  Component Value Date   Hemoglobin A1C 11.0 (H) 04/11/2024    Fasting Blood Sugar: No results found for: [facility] Services Provided: Psychiatric physician and nursing services, case management, recreational therapy, occupational therapy, and medical consultation . Multidisciplinary treatment plan(s) were established within 72 hours of admission; discussed daily and updated weekly. The patient had access to individual, group, and milieu therapeutic modalities.   Risk Assessment: On the day of discharge, the patient was evaluated by the attending physician and was discussed with the multidisciplinary treatment team. The treatment  team has determined the patient to be stable and appropriate for discharge with medical approval.   Day of discharge assessment included a suicide and violence risk assessment. Risk factors for self-harm/suicide that are present at time of discharge include: separated, current diagnosis of depression, suicide attempt leading to current admission, and previous acts of self-harm.   A violence risk assessment was performed on the day of discharge. Risk factors for aggression/violence that are present at time of discharge include: male gender. These risk factors are mitigated by the following factors: lack of active SI/HI, no know access to weapons or firearms, motivation for treatment, utilization of positive coping skills, current treatment compliance, effective problem solving skills, and presence of a safety plan with follow-up care. Furthermore, the treatment team has attempted to mitigate risk through supportive psychotherapy, providing psycho-education, thoughtful medication management, and communication with outpatient providers for continuity of care. The patient was educated about relevant modifiable risk factors including following recommendations for treatment of psychiatric illness and abstaining from substance abuse.    While future psychiatric events cannot be accurately predicted, the patient does not currently require further acute inpatient psychiatric care and does not currently meet [place]  involuntary commitment criteria. It is recommended that the patient continue treatment in outpatient care. A follow up plan and crisis plan are in place, have been discussed with the patient, and the patient agrees to the plan at time of discharge.   The treatment team provided psycho-education and made recommendations regarding medication adherence, adaptive coping strategies, healthy lifestyle modifications , and educational interventions.    Recommendation for discharge safety planning  included: removal of all firearms from the home, locking all sharps in a secure container, locking all weapons and dangerous objects in a secure container, and locking all medications in a secure container.     Condition at Discharge: fair Discharge Medications:    Your Medication List     STOP taking these medications    dapagliflozin propanediol 10 mg Tab tablet Commonly known as: FARXIGA   famotidine  20 MG tablet Commonly known as: PEPCID    ferrous sulfate 325 (65 FE) MG tablet   hydrOXYzine  25 MG tablet Commonly known as: ATARAX    lamoTRIgine  200 MG tablet Commonly known as: LaMICtal    lancets Misc Commonly known as: ACCU-CHEK SOFTCLIX LANCETS  lurasidone 40 mg Tab Commonly known as: LATUDA   metFORMIN  500 MG 24 hr tablet Commonly known as: GLUCOPHAGE -XR Replaced by: metFORMIN  1000 MG tablet   mirtazapine  15 MG tablet Commonly known as: REMERON    prazosin  2 MG capsule Commonly known as: MINIPRESS    prazosin  5 MG capsule Commonly known as: MINIPRESS    rosuvastatin  40 MG tablet Commonly known as: CRESTOR    traZODone  150 MG tablet Commonly known as: DESYREL    UNIFINE PENTIPS 32 gauge x 5/32 (4 mm) Ndle Generic drug: pen needle, diabetic       START taking these medications    atorvastatin  80 MG tablet Commonly known as: LIPITOR Take 1 tablet (80 mg total) by mouth daily.   metFORMIN  1000 MG tablet Commonly known as: GLUCOPHAGE  Take 1 tablet (1,000 mg total) by mouth in the morning and 1 tablet (1,000 mg total) in the evening. Take with meals. Replaces: metFORMIN  500 MG 24 hr tablet   nicotine 21 mg/24 hr patch Commonly known as: NICODERM CQ Place 1 patch on the skin daily.   risperiDONE 2 MG tablet Commonly known as: RisperDAL Take 1 tablet (2 mg total) by mouth nightly.       CHANGE how you take these medications    insulin  glargine 100 unit/mL (3 mL) injection pen Commonly known as: BASAGLAR , LANTUS  Inject 0.35 mL (35 Units total)  under the skin nightly. What changed:  how much to take when to take this   insulin  lispro 100 unit/mL injection pen Commonly known as: HumaLOG Inject 15 Units under the skin Three (3) times a day before meals. What changed:  how much to take when to take this   sertraline  50 MG tablet Commonly known as: ZOLOFT  Take 1 tablet (50 mg total) by mouth daily. Start taking on: April 18, 2024 What changed:  medication strength how much to take       CONTINUE taking these medications    glucose blood test strip Generic drug: blood sugar diagnostic Use to check blood sugar as directed with insulin  3 times a day & for symptoms of high or low blood sugar.        Advanced Directives: Does patient have an advance directive covering medical treatment?: Patient does not have advance directive covering medical treatment. Reason patient does not have an advance directive covering medical treatment:: Patient does not wish to complete one at this time.  Healthcare Decision Maker: Patient does not wish to appoint a Health Care Decision Maker at this time Extended Emergency Contact Information Primary Emergency Contact: Courtney,Zoe Mobile Phone: [phone] Relation: Domestic Partner Information offered on HCDM, Medical & Mental Health advance directives:: Patient declined information.  Does patient have an advance directive covering mental health treatment?: Patient does not have advance directive covering mental health treatment. Reason patient does not have an advance directive covering mental health treatment:: Patient does not wish to complete one at this time.  Discharge Instructions:    Appointments which have been scheduled for you    May 01, 2024 2:00 PM (Arrive by 1:45 PM) RETURN PODIATRY with Kayla Artist Setters, DPM [facility] [address] [facility]) [address]  [place] [address] [phone]    Additional instructions:   To establish care with a psychiatrist and learn about available therapies, walk in for an assessment: Monday through Thursday  8:30am-1pm  [facility]  [address]  [place], [place] [address]  Phone: [phone]  Fax: [phone]        Patient was seen and plan of care was discussed with the Attending MD, Marget , who agrees with the above statement and plan.  I spent greater than 30 minutes in the discharge of this patient.  Aliene MARGUARITE MATER, MD  I saw and evaluated the patient, participating in the key portions of the service on the day of discharge. I reviewed the discharge note and agree with discharge plans and disposition. I personally spent greater than 30 minutes in discharge planning services.   Leon Fredda Marget, MD

## 2024-04-18 NOTE — Care Plan (Signed)
  Care Management Final Transition Planning Assessment    04/17/2024  Patient's Post Acute Contact Information: [phone]  Additional transition plan information: Pt stabilized and discharged to community. Medications sent to [facility] and picked up at discharge. Aftercare follow-up arranged before discharge. Pt walked and utilized [employer] after discharge. DC plan reviewed with pt and pt amenable to plan before discharge. No further SW needs noted before discharge.  Has a PCP appointment been made?: N/A   Has a specialist appointment been made?: N/A   Post Acute Facility needed at discharge?: No        Home Care/ Home Medical Equipment needed at discharge?: No           Outpatient/Community Referrals needed for discharge?: Yes  Outpatient/Community Resources: Outpatient Therapy (specify), Outpatient Psychiatric Care, Outpatient Substance Abuse  Agency detail (Name/Phone #): [facility] Transportation Anticipated: none, public transportation  [employer]  - Discharged on 04/17/2024 Admission date: 04/11/2024 - Discharge disposition: Home with Self Care  No active coordination exists.    Human resources officer Address Phone Fax   [facility] [place] [facility]  Pending - No Request Sent In Use Addiction Outpatient Treatment, Crisis Services, Medications for Mental Health, Mental Health Education, Mental Health Outpatient Treatment, Substance Use Counseling [address] [place] [address] [phone]       Currently receiving outpatient dialysis?: N/A       Discharge Disposition: Homeless Shelter  Pt referred for substance abuse treatment with the following provider: (copy & past provider & appt details)  To establish care with a psychiatrist and learn about available therapies, walk in for an assessment: Monday through Thursday  8:30am-1pm  [facility]   [address]  [place], [place] [address]  Phone: [phone]  Fax: [phone]   DC Summary faxed to provider above    Quality data for continuing care services shared with patient and/or representative?: N/A Patient and/or family were provided with choice of facilities / services that are available and appropriate to meet post hospital care needs?: Yes  List choices in order highest to lowest preferred, if applicable. : [facility]  Discharge Packet Contents: Other, see Notes (AVS)  Final Assessment Complete: Yes                     Readmission Risk Score:  Predictive Model Details        15% (Medium)  Factor Value   Calculated 04/17/2024 16:00 29% Number of active inpatient medication orders 36   [facility] Risk of Unplanned Readmission Model 26% Number of ED visits in last six months 4   *Archived Data 11% Active antipsychotic inpatient medication order present    11% ECG/EKG order present in last 6 months    8% Imaging order present in last 6 months    6% Current length of stay 5.371 days    5% Age 32    2% Future appointment scheduled    1% Charlson Comorbidity Index 1    1% Active ulcer inpatient medication order present

## 2024-07-06 ENCOUNTER — Telehealth: Payer: Self-pay | Admitting: Licensed Clinical Social Worker
